# Patient Record
Sex: Male | Born: 1965 | Race: Black or African American | Hispanic: No | Marital: Married | Smoking: Former smoker
Health system: Southern US, Community
[De-identification: ages and names within clinical notes are randomized; demographics above are authoritative.]

## PROBLEM LIST (undated history)

## (undated) DIAGNOSIS — J309 Allergic rhinitis, unspecified: Secondary | ICD-10-CM

## (undated) DIAGNOSIS — K648 Other hemorrhoids: Secondary | ICD-10-CM

## (undated) DIAGNOSIS — Z8619 Personal history of other infectious and parasitic diseases: Secondary | ICD-10-CM

## (undated) DIAGNOSIS — I10 Essential (primary) hypertension: Secondary | ICD-10-CM

## (undated) DIAGNOSIS — F419 Anxiety disorder, unspecified: Secondary | ICD-10-CM

## (undated) DIAGNOSIS — R7303 Prediabetes: Secondary | ICD-10-CM

## (undated) DIAGNOSIS — T7840XA Allergy, unspecified, initial encounter: Secondary | ICD-10-CM

## (undated) DIAGNOSIS — I219 Acute myocardial infarction, unspecified: Secondary | ICD-10-CM

## (undated) DIAGNOSIS — I2510 Atherosclerotic heart disease of native coronary artery without angina pectoris: Secondary | ICD-10-CM

## (undated) HISTORY — DX: Essential (primary) hypertension: I10

## (undated) HISTORY — DX: Acute myocardial infarction, unspecified: I21.9

## (undated) HISTORY — DX: Allergic rhinitis, unspecified: J30.9

## (undated) HISTORY — DX: Atherosclerotic heart disease of native coronary artery without angina pectoris: I25.10

## (undated) HISTORY — DX: Anxiety disorder, unspecified: F41.9

## (undated) HISTORY — PX: MUSCLE BIOPSY: SHX716

## (undated) HISTORY — DX: Prediabetes: R73.03

## (undated) HISTORY — DX: Allergy, unspecified, initial encounter: T78.40XA

## (undated) HISTORY — PX: POLYPECTOMY: SHX149

## (undated) HISTORY — DX: Personal history of other infectious and parasitic diseases: Z86.19

## (undated) HISTORY — DX: Other hemorrhoids: K64.8

---

## 1997-08-09 ENCOUNTER — Emergency Department (HOSPITAL_COMMUNITY): Admission: EM | Admit: 1997-08-09 | Discharge: 1997-08-09 | Payer: Self-pay | Admitting: Emergency Medicine

## 1997-09-10 ENCOUNTER — Emergency Department (HOSPITAL_COMMUNITY): Admission: EM | Admit: 1997-09-10 | Discharge: 1997-09-10 | Payer: Self-pay | Admitting: Emergency Medicine

## 2000-12-19 ENCOUNTER — Emergency Department (HOSPITAL_COMMUNITY): Admission: EM | Admit: 2000-12-19 | Discharge: 2000-12-19 | Payer: Self-pay | Admitting: Emergency Medicine

## 2001-03-14 ENCOUNTER — Emergency Department (HOSPITAL_COMMUNITY): Admission: EM | Admit: 2001-03-14 | Discharge: 2001-03-14 | Payer: Self-pay | Admitting: Emergency Medicine

## 2001-05-03 ENCOUNTER — Emergency Department (HOSPITAL_COMMUNITY): Admission: EM | Admit: 2001-05-03 | Discharge: 2001-05-03 | Payer: Self-pay | Admitting: Emergency Medicine

## 2001-05-03 ENCOUNTER — Encounter: Payer: Self-pay | Admitting: Emergency Medicine

## 2002-09-15 ENCOUNTER — Emergency Department (HOSPITAL_COMMUNITY): Admission: EM | Admit: 2002-09-15 | Discharge: 2002-09-15 | Payer: Self-pay | Admitting: Emergency Medicine

## 2003-03-09 ENCOUNTER — Emergency Department (HOSPITAL_COMMUNITY): Admission: AD | Admit: 2003-03-09 | Discharge: 2003-03-09 | Payer: Self-pay | Admitting: Family Medicine

## 2004-05-07 ENCOUNTER — Encounter: Admission: RE | Admit: 2004-05-07 | Discharge: 2004-05-07 | Payer: Self-pay | Admitting: Family Medicine

## 2005-03-26 ENCOUNTER — Emergency Department (HOSPITAL_COMMUNITY): Admission: EM | Admit: 2005-03-26 | Discharge: 2005-03-26 | Payer: Self-pay | Admitting: Family Medicine

## 2005-07-21 ENCOUNTER — Emergency Department (HOSPITAL_COMMUNITY): Admission: EM | Admit: 2005-07-21 | Discharge: 2005-07-21 | Payer: Self-pay | Admitting: Family Medicine

## 2005-10-31 ENCOUNTER — Emergency Department (HOSPITAL_COMMUNITY): Admission: EM | Admit: 2005-10-31 | Discharge: 2005-10-31 | Payer: Self-pay | Admitting: Family Medicine

## 2006-01-29 ENCOUNTER — Emergency Department (HOSPITAL_COMMUNITY): Admission: EM | Admit: 2006-01-29 | Discharge: 2006-01-29 | Payer: Self-pay | Admitting: Family Medicine

## 2006-02-07 ENCOUNTER — Emergency Department (HOSPITAL_COMMUNITY): Admission: EM | Admit: 2006-02-07 | Discharge: 2006-02-07 | Payer: Self-pay | Admitting: Emergency Medicine

## 2006-02-19 ENCOUNTER — Encounter: Admission: RE | Admit: 2006-02-19 | Discharge: 2006-02-19 | Payer: Self-pay | Admitting: Sports Medicine

## 2006-03-08 ENCOUNTER — Encounter: Admission: RE | Admit: 2006-03-08 | Discharge: 2006-04-01 | Payer: Self-pay | Admitting: Sports Medicine

## 2006-03-28 ENCOUNTER — Emergency Department (HOSPITAL_COMMUNITY): Admission: EM | Admit: 2006-03-28 | Discharge: 2006-03-28 | Payer: Self-pay | Admitting: Family Medicine

## 2006-10-22 ENCOUNTER — Emergency Department (HOSPITAL_COMMUNITY): Admission: EM | Admit: 2006-10-22 | Discharge: 2006-10-22 | Payer: Self-pay | Admitting: Family Medicine

## 2007-12-25 ENCOUNTER — Emergency Department (HOSPITAL_COMMUNITY): Admission: EM | Admit: 2007-12-25 | Discharge: 2007-12-25 | Payer: Self-pay | Admitting: Emergency Medicine

## 2008-10-16 ENCOUNTER — Encounter: Admission: RE | Admit: 2008-10-16 | Discharge: 2008-11-21 | Payer: Self-pay | Admitting: Internal Medicine

## 2009-06-30 ENCOUNTER — Emergency Department (HOSPITAL_COMMUNITY): Admission: EM | Admit: 2009-06-30 | Discharge: 2009-06-30 | Payer: Self-pay | Admitting: Emergency Medicine

## 2010-01-05 ENCOUNTER — Emergency Department (HOSPITAL_COMMUNITY): Admission: EM | Admit: 2010-01-05 | Discharge: 2010-01-05 | Payer: Self-pay | Admitting: Family Medicine

## 2010-02-10 ENCOUNTER — Ambulatory Visit (HOSPITAL_COMMUNITY)
Admission: RE | Admit: 2010-02-10 | Discharge: 2010-02-10 | Payer: Self-pay | Source: Home / Self Care | Attending: General Surgery | Admitting: General Surgery

## 2010-05-05 LAB — SURGICAL PCR SCREEN: MRSA, PCR: NEGATIVE

## 2011-01-24 DIAGNOSIS — R7303 Prediabetes: Secondary | ICD-10-CM

## 2011-01-24 HISTORY — DX: Prediabetes: R73.03

## 2011-02-10 ENCOUNTER — Encounter: Payer: Self-pay | Admitting: *Deleted

## 2011-02-19 ENCOUNTER — Encounter: Payer: BC Managed Care – PPO | Attending: Nurse Practitioner | Admitting: *Deleted

## 2011-02-19 DIAGNOSIS — R7309 Other abnormal glucose: Secondary | ICD-10-CM | POA: Insufficient documentation

## 2011-02-19 DIAGNOSIS — Z713 Dietary counseling and surveillance: Secondary | ICD-10-CM | POA: Insufficient documentation

## 2011-02-20 ENCOUNTER — Encounter: Payer: Self-pay | Admitting: *Deleted

## 2011-02-20 NOTE — Patient Instructions (Addendum)
Goals:  Follow Diabetes Meal Plan as instructed (yellow card).  Eat 3 meals and 1-2 snacks daily; AVOID meal skipping.  Add lean protein foods to all meals/snacks.  Continue current physical activity regimen; gradually work up to 60 min/day.  Bring food record to your next nutrition visit.  Limit/avoid sweets and sugar sweetened beverages.  Limit meals away from home and high fat/fried foods.

## 2011-02-20 NOTE — Progress Notes (Signed)
Medical Nutrition Therapy:  Appt start time: 1000 end time:  1100.  Assessment:  Prediabetes.  Pt here with wife for assessment of prediabetes and education.  Wife reports h/o elevated A1c (5.8% - date unknown) that increased to 6.1% (12/30/10). Pt wants to avoid T2DM diagnosis and meds. Previous dietary intake includes excessive intake of sweets and regular soda. Mother and brother have T2DM. Pt exercises 6 days/wk and has decreased simple/refined sugar intake and eating out.  Is following a meal plan from referring  NP, but states he is confused with it.  Pt denies any pain at this time.   MEDICATIONS: See medication list; reconciled with patient.   DIETARY INTAKE:  Usual eating pattern includes 3 meals and 0-1 snacks per day. Pt reports h/o excessive intake of sweets (4 reg sized candy bars/day) and regular sodas (2-3/day); has now decreased intake.  Previously increased meals away from home, which have also decreased.   Usual physical activity:  45 min of cardio 6 days/wk  Estimated energy needs: 1800 calories 200 g carbohydrates (spread over meals/snacks) 125 g protein 55 g fat  Progress Towards Goal(s):  In progress.   Nutritional Diagnosis:  [place]-2.1 Inpaired nutrition utilization As related to glucose metabolism.  As evidenced by recent increase in A1c from 5.8% to 6.1% and NP referral for DM education.    Intervention/Goals:  Follow Diabetes Meal Plan as instructed (yellow card).  Eat 3 meals and 1-2 snacks daily; AVOID meal skipping.  Add lean protein foods to all meals/snacks.  Continue current physical activity regimen; gradually work up to 60 min/day.  Bring food record to your next nutrition visit.  Limit/avoid sweets and sugar sweetened beverages.  Limit meals away from home and high fat/fried foods.  Handouts given during visit include:  Living Well with Diabetes - [employer]  Carb Counting and Meal Planning guide  45g and 60g CHO meal plans  Snack List  Mr.  Idell Pickles Quick and Easy Diabetic Cooking (cookbook)  Monitoring/Evaluation:  Dietary intake, exercise, A1c, and body weight in 2 month(s).

## 2012-11-04 ENCOUNTER — Other Ambulatory Visit: Payer: Self-pay

## 2014-01-03 ENCOUNTER — Emergency Department (HOSPITAL_COMMUNITY): Payer: BC Managed Care – PPO

## 2014-01-03 ENCOUNTER — Encounter (HOSPITAL_COMMUNITY): Payer: Self-pay

## 2014-01-03 ENCOUNTER — Emergency Department (HOSPITAL_COMMUNITY)
Admission: EM | Admit: 2014-01-03 | Discharge: 2014-01-03 | Disposition: A | Discharge status: Home / Self Care | Payer: BC Managed Care – PPO | Attending: Emergency Medicine | Admitting: Emergency Medicine

## 2014-01-03 DIAGNOSIS — Z79899 Other long term (current) drug therapy: Secondary | ICD-10-CM | POA: Insufficient documentation

## 2014-01-03 DIAGNOSIS — Z87891 Personal history of nicotine dependence: Secondary | ICD-10-CM | POA: Insufficient documentation

## 2014-01-03 DIAGNOSIS — Z7951 Long term (current) use of inhaled steroids: Secondary | ICD-10-CM | POA: Diagnosis not present

## 2014-01-03 DIAGNOSIS — J45909 Unspecified asthma, uncomplicated: Secondary | ICD-10-CM | POA: Insufficient documentation

## 2014-01-03 DIAGNOSIS — R002 Palpitations: Secondary | ICD-10-CM | POA: Diagnosis present

## 2014-01-03 DIAGNOSIS — Z8719 Personal history of other diseases of the digestive system: Secondary | ICD-10-CM | POA: Diagnosis not present

## 2014-01-03 DIAGNOSIS — I493 Ventricular premature depolarization: Secondary | ICD-10-CM | POA: Diagnosis not present

## 2014-01-03 LAB — CBC WITH DIFFERENTIAL/PLATELET
BASOS ABS: 0 10*3/uL (ref 0.0–0.1)
Basophils Relative: 0 % (ref 0–1)
EOS PCT: 1 % (ref 0–5)
Eosinophils Absolute: 0.1 10*3/uL (ref 0.0–0.7)
HEMATOCRIT: 45.9 % (ref 39.0–52.0)
Hemoglobin: 15.9 g/dL (ref 13.0–17.0)
Lymphocytes Relative: 42 % (ref 12–46)
Lymphs Abs: 2.7 10*3/uL (ref 0.7–4.0)
MCH: 30.2 pg (ref 26.0–34.0)
MCHC: 34.6 g/dL (ref 30.0–36.0)
MCV: 87.3 fL (ref 78.0–100.0)
Monocytes Absolute: 0.5 10*3/uL (ref 0.1–1.0)
Monocytes Relative: 8 % (ref 3–12)
Neutro Abs: 3.1 10*3/uL (ref 1.7–7.7)
Neutrophils Relative %: 49 % (ref 43–77)
PLATELETS: 228 10*3/uL (ref 150–400)
RBC: 5.26 MIL/uL (ref 4.22–5.81)
RDW: 12.9 % (ref 11.5–15.5)
WBC: 6.4 10*3/uL (ref 4.0–10.5)

## 2014-01-03 LAB — COMPREHENSIVE METABOLIC PANEL
ALBUMIN: 4.1 g/dL (ref 3.5–5.2)
ALT: 44 U/L (ref 0–53)
AST: 36 U/L (ref 0–37)
Alkaline Phosphatase: 54 U/L (ref 39–117)
Anion gap: 15 (ref 5–15)
BUN: 17 mg/dL (ref 6–23)
CHLORIDE: 101 meq/L (ref 96–112)
CO2: 25 mEq/L (ref 19–32)
Calcium: 9.5 mg/dL (ref 8.4–10.5)
Creatinine, Ser: 1.18 mg/dL (ref 0.50–1.35)
GFR, EST AFRICAN AMERICAN: 84 mL/min — AB (ref >90)
GFR, EST NON AFRICAN AMERICAN: 72 mL/min — AB (ref >90)
GLUCOSE: 106 mg/dL — AB (ref 70–99)
Potassium: 3.9 mEq/L (ref 3.7–5.3)
Sodium: 141 mEq/L (ref 137–147)
Total Protein: 7.6 g/dL (ref 6.0–8.3)

## 2014-01-03 LAB — I-STAT TROPONIN, ED: TROPONIN I, POC: 0 ng/mL (ref 0.00–0.08)

## 2014-01-03 LAB — MAGNESIUM: Magnesium: 2.3 mg/dL (ref 1.5–2.5)

## 2014-01-03 NOTE — ED Notes (Signed)
Pt states that he noticed an irregular heart beat on Saturday, tonight he states it's worse.

## 2014-01-03 NOTE — ED Provider Notes (Signed)
CSN: [id number]     Arrival date & time 01/03/14  1926 History   First MD Initiated Contact with Patient 01/03/14 2000     Chief Complaint  Patient presents with  . Irregular Heart Beat     (Consider location/radiation/quality/duration/timing/severity/associated sxs/prior Treatment) HPI Mike Schmidt is a 48 y.o. male with hx of prediabetes, asthma, presents to ED with complaint of palpitations. States4 days ago started having intermittent palpitations, states feels like his heart is out of rhythm. Episodes last approximately 2-3 seconds, multiple episodes every hour. He denies any associated chest pain, shortness of breath, dizziness. No symptoms on exertion. States symptoms are independent of activity or position. No recent surgeries or travel. No new medications. No other complaints.  Past Medical History  Diagnosis Date  . Asthma   . Prediabetes 01/2011  . Internal hemorrhoids without mention of complication   . Allergic rhinitis, cause unspecified     Pollen   Past Surgical History  Procedure Laterality Date  . Muscle biopsy     Family History  Problem Relation Age of Onset  . Diabetes Mother     T2DM  . Hypertension Father   . Depression Father   . Hypertension Sister   . Hypertension Brother   . Diabetes Brother     T2DM   History  Substance Use Topics  . Smoking status: Former Research scientist (life sciences)  . Smokeless tobacco: Not on file  . Alcohol Use: No    Review of Systems  Constitutional: Negative for fever and chills.  Respiratory: Negative for cough, chest tightness and shortness of breath.   Cardiovascular: Positive for palpitations. Negative for chest pain and leg swelling.  Gastrointestinal: Negative for nausea, vomiting, abdominal pain, diarrhea and abdominal distention.  Genitourinary: Negative for dysuria, urgency, frequency and hematuria.  Musculoskeletal: Negative for myalgias, arthralgias, neck pain and neck stiffness.  Skin: Negative for rash.   Allergic/Immunologic: Negative for immunocompromised state.  Neurological: Negative for dizziness, syncope, weakness, light-headedness, numbness and headaches.      Allergies  Review of patient's allergies indicates no known allergies.  Home Medications   Prior to Admission medications   Medication Sig Start Date End Date Taking? Authorizing Provider  cetirizine (ZYRTEC) 10 MG tablet Take 10 mg by mouth at bedtime.     Yes Historical Provider, MD  diphenhydramine-acetaminophen (TYLENOL PM) 25-500 MG TABS Take 2 tablets by mouth at bedtime as needed (sleep).   Yes Historical Provider, MD  fluticasone (FLONASE) 50 MCG/ACT nasal spray Place 2 sprays into the nose 2 (two) times daily.     Yes Historical Provider, MD  Fluticasone-Salmeterol (ADVAIR DISKUS) 250-50 MCG/DOSE AEPB Inhale 1 puff into the lungs every 12 (twelve) hours.     Yes Historical Provider, MD  Multiple Vitamins-Minerals (MULTIVITAL) tablet Take 1 tablet by mouth daily.     Yes Historical Provider, MD  tadalafil (CIALIS) 20 MG tablet Take 20 mg by mouth daily as needed for erectile dysfunction (erectile dysfunction).   Yes Historical Provider, MD  zolpidem (AMBIEN CR) 12.5 MG CR tablet Take 12.5 mg by mouth at bedtime as needed for sleep (sleep).   Yes Historical Provider, MD  phentermine 37.5 MG capsule Take 37.5 mg by mouth every morning.      Historical Provider, MD   BP 125/79 mmHg  Pulse 86  Temp(Src) 97.6 F (36.4 C) (Oral)  Resp 20  Ht 5\' 9"  (1.753 m)  Wt 175 lb (79.379 kg)  BMI 25.83 kg/m2  SpO2 98% Physical Exam  Constitutional: He is oriented to person, place, and time. He appears well-developed and well-nourished. No distress.  HENT:  Head: Normocephalic and atraumatic.  Eyes: Conjunctivae are normal.  Neck: Neck supple.  Cardiovascular: Normal rate, regular rhythm and normal heart sounds.   Pulmonary/Chest: Effort normal. No respiratory distress. He has no wheezes. He has no rales.  Abdominal: Soft.  Bowel sounds are normal. He exhibits no distension. There is no tenderness. There is no rebound.  Musculoskeletal: He exhibits no edema.  Neurological: He is alert and oriented to person, place, and time.  Skin: Skin is warm and dry.  Nursing note and vitals reviewed.   ED Course  Procedures (including critical care time) Labs Review Labs Reviewed  COMPREHENSIVE METABOLIC PANEL - Abnormal; Notable for the following:    Glucose, Bld 106 (*)    Total Bilirubin <0.2 (*)    GFR calc non Af Amer 72 (*)    GFR calc Af Amer 84 (*)    All other components within normal limits  CBC WITH DIFFERENTIAL  MAGNESIUM  I-STAT TROPOININ, ED    Imaging Review Dg Chest 2 View  01/03/2014   CLINICAL DATA:  Cardiac palpitations  EXAM: CHEST  2 VIEW  COMPARISON:  08/21/2009  FINDINGS: The heart size and mediastinal contours are within normal limits. Both lungs are clear. The visualized skeletal structures shoulder rib fractures on the left.  IMPRESSION: No active cardiopulmonary disease.   Electronically Signed   By: Inez Catalina M.D.   On: 01/03/2014 20:55     EKG Interpretation   Date/Time:  Wednesday January 03 2014 19:32:44 EST Ventricular Rate:  82 PR Interval:  126 QRS Duration: 87 QT Interval:  350 QTC Calculation: 409 R Axis:   72 Text Interpretation:  Sinus rhythm Borderline low voltage, extremity leads  ST elev, probable normal early repol pattern Baseline wander in lead(s) II  aVR V6 Confirmed by Jeneen Rinks  MD, [place] ([id number]) on 01/03/2014 9:59:15 PM      MDM   Final diagnoses:  Palpitations  PVC's (premature ventricular contractions)    pt with palpitations. Single occasional PVCs noted on the monitor. No CP, no SOB, no exertional symptoms, no dizziness. Will check labs, CXR, will monitor.    10:41 PM Occasional PVCs, no other arrhythmias. Pt monitored for 3 hrs. Labs unremarkable. Pt admits to sress, not sleeping well. No other possible triggers. Discussed with Dr. Jeneen Rinks. Plan  to DC home with cardiology follow up. VS are normal. Pt stable for dc home.   Filed Vitals:   01/03/14 1935 01/03/14 2147  BP: 125/79 136/79  Pulse: 86   Temp: 97.6 F (36.4 C) 98.2 F (36.8 C)  TempSrc: Oral Oral  Resp: 20 20  Height: 5\' 9"  (1.753 m)   Weight: 175 lb (79.379 kg)   SpO2: 98% 100%     Renold Genta, PA-C 01/03/14 2242  Tanna Furry, MD 01/13/14 2336

## 2014-01-03 NOTE — Discharge Instructions (Signed)
Avoid any over the counter medications or supplements other than what you are already on. Drink plenty of fluids. Rest. Follow up with cardiology. Return if worsening symptoms.    Premature Ventricular Contraction Premature ventricular contraction (PVC) is an irregularity of the heart rhythm involving extra or skipped heartbeats. In some cases, they may occur without obvious cause or heart disease. Other times, they can be caused by an electrolyte change in the blood. These need to be corrected. They can also be seen when there is not enough oxygen going to the heart. A common cause of this is plaque or cholesterol buildup. This buildup decreases the blood supply to the heart. In addition, extra beats may be caused or aggravated by:  Excessive smoking.  Alcohol consumption.  Caffeine.  Certain medications  Some street drugs. SYMPTOMS   The sensation of feeling your heart skipping a beat (palpitations).  In many cases, the person may have no symptoms. SIGNS AND TESTS   A physical examination may show an occasional irregularity, but if the PVC beats do not happen often, they may not be found on physical exam.  Blood pressure is usually normal.  Other tests that may find extra beats of the heart are:  An EKG (electrocardiogram)  A Holter monitor which can monitor your heart over longer periods of time  An Angiogram (study of the heart arteries). TREATMENT  Usually extra heartbeats do not need treatment. The condition is treated only if symptoms are severe or if extra beats are very frequent or are causing problems. An underlying cause, if discovered, may also require treatment.  Treatment may also be needed if there may be a risk for other more serious cardiac arrhythmias.  PREVENTION   Moderation in caffeine, alcohol, and tobacco use may reduce the risk of ectopic heartbeats in some people.  Exercise often helps people who lead a sedentary (inactive) lifestyle. PROGNOSIS  PVC  heartbeats are generally harmless and do not need treatment.  RISKS AND COMPLICATIONS   Ventricular tachycardia (occasionally).  There usually are no complications.  Other arrhythmias (occasionally). SEEK IMMEDIATE MEDICAL CARE IF:   You feel palpitations that are frequent or continual.  You develop chest pain or other problems such as shortness of breath, sweating, or nausea and vomiting.  You become light-headed or faint (pass out).  You get worse or do not improve with treatment. Document Released: 09/27/2003 Document Revised: 05/04/2011 Document Reviewed: 04/08/2007 [facility] Patient Information 2015 [place], [place]. This information is not intended to replace advice given to you by your health care provider. Make sure you discuss any questions you have with your health care provider.

## 2014-02-15 NOTE — Progress Notes (Signed)
Patient ID: Mike Schmidt, male   DOB: [birth year], 48 y.o.   MRN: [id number]     Mike Schmidt is a 48 y.o. male with hx of prediabetes, asthma, presents to ED 01/03/14  with complaint of palpitations. States4 days ago started having intermittent palpitations, states feels like his heart is out of rhythm. Episodes last approximately 2-3 seconds, multiple episodes every hour. He denies any associated chest pain, shortness of breath, dizziness. No symptoms on exertion. States symptoms are independent of activity or position. No recent surgeries or travel. No new medications. No other complaints. Reviwed records and labs ok Telemetry with isolated PVC;s  CXR NAD  D/C for outpatient f/u  Since then has had more sleep and going to gym no recurrent palpitations Had issues with alcohol and drug abuse 16 years ago but not now and sober  No longer taking phentyramine Was on for short time to loose wieght when he quit smoking a while back     ROS: Denies fever, malais, weight loss, blurry vision, decreased visual acuity, cough, sputum, SOB, hemoptysis, pleuritic pain, palpitaitons, heartburn, abdominal pain, melena, lower extremity edema, claudication, or rash.  All other systems reviewed and negative   General: Affect appropriate Healthy:  appears stated age 89: normal Neck supple with no adenopathy JVP normal no bruits no thyromegaly Lungs clear with no wheezing and good diaphragmatic motion Heart:  S1/S2 no murmur,rub, gallop or click PMI normal Abdomen: benighn, BS positve, no tenderness, no AAA no bruit.  No HSM or HJR Distal pulses intact with no bruits No edema Neuro non-focal Skin warm and dry No muscular weakness  Medications Current Outpatient Prescriptions  Medication Sig Dispense Refill  . cetirizine (ZYRTEC) 10 MG tablet Take 10 mg by mouth at bedtime.      . diphenhydramine-acetaminophen (TYLENOL PM) 25-500 MG TABS Take 2 tablets by mouth at bedtime as needed (sleep).    .  fluticasone (FLONASE) 50 MCG/ACT nasal spray Place 2 sprays into the nose 2 (two) times daily.      . Fluticasone-Salmeterol (ADVAIR DISKUS) 250-50 MCG/DOSE AEPB Inhale 1 puff into the lungs every 12 (twelve) hours.      . Multiple Vitamins-Minerals (MULTIVITAL) tablet Take 1 tablet by mouth daily.      . phentermine 37.5 MG capsule Take 37.5 mg by mouth every morning.      . tadalafil (CIALIS) 20 MG tablet Take 20 mg by mouth daily as needed for erectile dysfunction (erectile dysfunction).    Marland Kitchen zolpidem (AMBIEN CR) 12.5 MG CR tablet Take 12.5 mg by mouth at bedtime as needed for sleep (sleep).     No current facility-administered medications for this visit.    Allergies Review of patient's allergies indicates no known allergies.  Family History: Family History  Problem Relation Age of Onset  . Diabetes Mother     T2DM  . Hypertension Father   . Depression Father   . Hypertension Sister   . Hypertension Brother   . Diabetes Brother     T2DM    Social History: History   Social History  . Marital Status: Married    Spouse Name: N/A    Number of Children: N/A  . Years of Education: N/A   Occupational History  . Not on file.   Social History Main Topics  . Smoking status: Former Research scientist (life sciences)  . Smokeless tobacco: Not on file  . Alcohol Use: No  . Drug Use: No  . Sexual Activity: Not on file  Other Topics Concern  . Not on file   Social History Narrative    Past Surgical History  Procedure Laterality Date  . Muscle biopsy      Past Medical History  Diagnosis Date  . Asthma   . Prediabetes 01/2011  . Internal hemorrhoids without mention of complication   . Allergic rhinitis, cause unspecified     Pollen    Electrocardiogram: SR rate 82 early repol short PR 126 msec no pre excitation 01/03/14   Assessment and Plan

## 2014-02-19 ENCOUNTER — Ambulatory Visit (INDEPENDENT_AMBULATORY_CARE_PROVIDER_SITE_OTHER): Payer: BC Managed Care – PPO | Admitting: Cardiovascular Disease

## 2014-02-19 ENCOUNTER — Encounter: Payer: Self-pay | Admitting: Cardiovascular Disease

## 2014-02-19 VITALS — BP 134/82 | HR 95 | Ht 69.0 in | Wt 179.0 lb

## 2014-02-19 DIAGNOSIS — R002 Palpitations: Secondary | ICD-10-CM

## 2014-02-19 NOTE — Assessment & Plan Note (Signed)
Benign sounding and improved with less caffeine , sleep and going back to gym  Echo to r/o structural heart disease given history of smoking, drug use and ETOH

## 2014-02-19 NOTE — Patient Instructions (Signed)
Your physician has requested that you have an echocardiogram. Echocardiography is a painless test that uses sound waves to create images of your heart. It provides your doctor with information about the size and shape of your heart and how well your heart's chambers and valves are working. This procedure takes approximately one hour. There are no restrictions for this procedure.  Follow up with Dr. Johnsie Cancel as needed.

## 2014-02-20 ENCOUNTER — Ambulatory Visit (HOSPITAL_COMMUNITY): Payer: BC Managed Care – PPO | Attending: Cardiovascular Disease

## 2014-02-20 DIAGNOSIS — Z87891 Personal history of nicotine dependence: Secondary | ICD-10-CM | POA: Diagnosis not present

## 2014-02-20 DIAGNOSIS — J45909 Unspecified asthma, uncomplicated: Secondary | ICD-10-CM | POA: Diagnosis not present

## 2014-02-20 DIAGNOSIS — F1910 Other psychoactive substance abuse, uncomplicated: Secondary | ICD-10-CM | POA: Diagnosis not present

## 2014-02-20 DIAGNOSIS — F1021 Alcohol dependence, in remission: Secondary | ICD-10-CM | POA: Insufficient documentation

## 2014-02-20 DIAGNOSIS — R002 Palpitations: Secondary | ICD-10-CM | POA: Insufficient documentation

## 2014-02-20 NOTE — Progress Notes (Signed)
2D Echo completed. 02/20/2014

## 2015-11-10 NOTE — Progress Notes (Signed)
Cardiology Office Note    Date:  11/12/2015   ID:  KASH MCLINN, DOB [birth year], MRN [id number]  PCP:  Merrilee Seashore, MD  Cardiologist:  Dr. Johnsie Cancel   CC: fluttering in chest and chest pain   History of Present Illness:  Mike Schmidt is a 50 y.o. male with a history of prediabtes, obesity, substance abuse in remission and asthma who presents to clinic for evaluation of palpitations.  He saw Dr. Johnsie Cancel in 01/2014 for post ER follow up of palpitations. He had been getting more sleep and exercise and less caffiene and felt better at the time. ECHO was ordered given hx of smoking, drug use and ETOH. This showed normal LV function with no WMAS and mild LAE.  Today he presents to clinic for follow up. Since that time he has had intermittent palpitations as well as some sharp stabbing pain across his chest. Also has had some back pain and leg cramping. He has palpitations basically everyday intermittently. No associated CP or SOB. He does get chest pain but not related to palpitations. The chest pain feels like a sharp shooting pain or a tightness in the left side of his chest. Not related to exertion. He still does work out a little with push ups/sit ups but does not go to the gym as much as he used to. No smoking, alcohol or drugs.  He drinks one cup of coffee in the AM. No LE edema, orthopnea or PND. No dizziness or syncope. Drinks one cup of coffee in the AM, otherwise no caffeine. He does have a lot of stress in his life related to work and family. Wife with him today who sees him grabbing his chest from time to time.   No HTN or DM and he see his PCP at least once a year. He thinks his cholesterol was a little elevated once but then his levels got better. His dad and brother both had heart problems. His brother has his first MI in his 67s but was a smoker, drinker and drug user. His father had CHF and MIs and CVAs in 24s and 59s. Also heavy smoker and drinker.      Past Medical  History:  Diagnosis Date  . Allergic rhinitis, cause unspecified    Pollen  . Asthma   . Internal hemorrhoids without mention of complication   . Prediabetes 01/2011    Past Surgical History:  Procedure Laterality Date  . MUSCLE BIOPSY      Current Medications: Outpatient Medications Prior to Visit  Medication Sig Dispense Refill  . cetirizine (ZYRTEC) 10 MG tablet Take 10 mg by mouth at bedtime.      . diphenhydramine-acetaminophen (TYLENOL PM) 25-500 MG TABS Take 2 tablets by mouth at bedtime as needed (sleep).    . fluticasone (FLONASE) 50 MCG/ACT nasal spray Place 2 sprays into the nose 2 (two) times daily.      . Fluticasone-Salmeterol (ADVAIR DISKUS) 250-50 MCG/DOSE AEPB Inhale 1 puff into the lungs every 12 (twelve) hours.      . Multiple Vitamins-Minerals (MULTIVITAL) tablet Take 1 tablet by mouth daily.      . tadalafil (CIALIS) 20 MG tablet Take 20 mg by mouth daily as needed for erectile dysfunction (erectile dysfunction).    . phentermine 37.5 MG capsule Take 37.5 mg by mouth every morning.      . zolpidem (AMBIEN CR) 12.5 MG CR tablet Take 12.5 mg by mouth at bedtime as needed for sleep (sleep).  No facility-administered medications prior to visit.      Allergies:   Review of patient's allergies indicates no known allergies.   Social History   Social History  . Marital status: Married    Spouse name: N/A  . Number of children: N/A  . Years of education: N/A   Social History Main Topics  . Smoking status: Former Research scientist (life sciences)  . Smokeless tobacco: Never Used  . Alcohol use No  . Drug use: No  . Sexual activity: Not Asked   Other Topics Concern  . None   Social History Narrative  . None     Family History:  The patient's *family history includes Depression in his father; Diabetes in his brother and mother; Hypertension in his brother, father, and sister.     ROS:   Please see the history of present illness.    ROS All other systems reviewed and are  negative.   PHYSICAL EXAM:   VS:  BP 132/80   Pulse 94   Ht 5\' 8"  (1.727 m)   Wt 184 lb 6.4 oz (83.6 kg)   SpO2 95%   BMI 28.04 kg/m    GEN: Well nourished, well developed, in no acute distress  HEENT: normal  Neck: no JVD, carotid bruits, or masses Cardiac: RRR; no murmurs, rubs, or gallops,no edema  Respiratory:  clear to auscultation bilaterally, normal work of breathing GI: soft, nontender, nondistended, + BS MS: no deformity or atrophy  Skin: warm and dry, no rash Neuro:  Alert and Oriented x 3, Strength and sensation are intact Psych: euthymic mood, full affect  Wt Readings from Last 3 Encounters:  11/12/15 184 lb 6.4 oz (83.6 kg)  02/19/14 179 lb (81.2 kg)  01/03/14 175 lb (79.4 kg)      Studies/Labs Reviewed:   EKG:  EKG is ordered today.  The ekg ordered today demonstrates NSR HR 93  Recent Labs: No results found for requested labs within last 8760 hours.   Lipid Panel No results found for: CHOL, TRIG, HDL, CHOLHDL, VLDL, LDLCALC, LDLDIRECT  Additional studies/ records that were reviewed today include:  2D ECHO: 02/20/2014 LV EF: 60% -  65% Study Conclusion - Left ventricle: The cavity size was normal. Wall thickness was normal. Systolic function was normal. The estimated ejection fraction was in the range of 60% to 65%. Wall motion was normal; there were no regional wall motion abnormalities. - Right atrium: The atrium was mildly dilated.   ASSESSMENT & PLAN:   Palpitations: he has subjective palpitatations everyday. Will get a 48 hour holter monitor  Chest pain: atypical but with family history and history of smoking/drug abuse, will get a POET. Will check lipid panel today.   Hx of substance abuse: in remission.   LE cramping: will check a BMET   Medication Adjustments/Labs and Tests Ordered: Current medicines are reviewed at length with the patient today.  Concerns regarding medicines are outlined above.  Medication changes, Labs and  Tests ordered today are listed in the Patient Instructions below. Patient Instructions  Medication Instructions:  Your physician recommends that you continue on your current medications as directed. Please refer to the Current Medication list given to you today.    Labwork: TODAY;  BMET & LIPID PANEL  Testing/Procedures: Your physician has requested that you have an exercise tolerance test. For further information please visit [email or web address]. Please also follow instruction sheet, as given.  Your physician has recommended that you wear a holter monitor. Holter monitors are medical devices  that record the heart's electrical activity. Doctors most often use these monitors to diagnose arrhythmias. Arrhythmias are problems with the speed or rhythm of the heartbeat. The monitor is a small, portable device. You can wear one while you do your normal daily activities. This is usually used to diagnose what is causing palpitations/syncope (passing out).    Follow-Up: Your physician recommends that you schedule a follow-up appointment in: WILL BE BASED UPON YOUR TEST RESULTS   Any Other Special Instructions Will Be Listed Below (If Applicable).   Exercise Stress Electrocardiogram An exercise stress electrocardiogram is a test to check how blood flows to your heart. It is done to find areas of poor blood flow. You will need to walk on a treadmill for this test. The electrocardiogram will record your heartbeat when you are at rest and when you are exercising. BEFORE THE PROCEDURE  Do not have drinks with caffeine or foods with caffeine for 24 hours before the test, or as told by your doctor. This includes coffee, tea (even decaf tea), sodas, chocolate, and cocoa.  Follow your doctor's instructions about eating and drinking before the test.  Ask your doctor what medicines you should or should not take before the test. Take your medicines with water unless told by your doctor not to.  If you  use an inhaler, bring it with you to the test.  Bring a snack to eat after the test.  Do not  smoke for 4 hours before the test.  Do not put lotions, powders, creams, or oils on your chest before the test.  Wear comfortable shoes and clothing. PROCEDURE  You will have patches put on your chest. Small areas of your chest may need to be shaved. Wires will be connected to the patches.  Your heart rate will be watched while you are resting and while you are exercising.  You will walk on the treadmill. The treadmill will slowly get faster to raise your heart rate.  The test will take about 1-2 hours. AFTER THE PROCEDURE  Your heart rate and blood pressure will be watched after the test.  You may return to your normal diet, activities, and medicines or as told by your doctor.   This information is not intended to replace advice given to you by your health care provider. Make sure you discuss any questions you have with your health care provider.   Document Released: 07/29/2007 Document Revised: 03/02/2014 Document Reviewed: 10/17/2012 Elsevier Interactive Patient Education 2016 Elsevier Inc.     Holter Monitoring A Holter monitor is a small device that is used to detect abnormal heart rhythms. It clips to your clothing and is connected by wires to flat, sticky disks (electrodes) that attach to your chest. It is worn continuously for 24-48 hours. HOME CARE INSTRUCTIONS  Wear your Holter monitor at all times, even while exercising and sleeping, for as long as directed by your health care provider.  Make sure that the Holter monitor is safely clipped to your clothing or close to your body as recommended by your health care provider.  Do not get the monitor or wires wet.  Do not put body lotion or moisturizer on your chest.  Keep your skin clean.  Keep a diary of your daily activities, such as walking and doing chores. If you feel that your heartbeat is abnormal or that your  heart is fluttering or skipping a beat:  Record what you are doing when it happens.  Record what time of day  the symptoms occur.  Return your Holter monitor as directed by your health care provider.  Keep all follow-up visits as directed by your health care provider. This is important. SEEK IMMEDIATE MEDICAL CARE IF:  You feel lightheaded or you faint.  You have trouble breathing.  You feel pain in your chest, upper arm, or jaw.  You feel sick to your stomach and your skin is pale, cool, or damp.  You heartbeat feels unusual or abnormal.   This information is not intended to replace advice given to you by your health care provider. Make sure you discuss any questions you have with your health care provider.   Document Released: 11/08/2003 Document Revised: 03/02/2014 Document Reviewed: 09/18/2013 Elsevier Interactive Patient Education [employer].   If you need a refill on your cardiac medications before your next appointment, please call your pharmacy.       Signed, Angelena Form, PA-C  11/12/2015 10:38 AM    [facility] [address], [place], [place]  [address] Phone: [phone]; Fax: [phone]

## 2015-11-11 ENCOUNTER — Encounter: Payer: Self-pay | Admitting: Physician Assistant

## 2015-11-12 ENCOUNTER — Encounter (INDEPENDENT_AMBULATORY_CARE_PROVIDER_SITE_OTHER): Payer: Self-pay

## 2015-11-12 ENCOUNTER — Encounter: Payer: Self-pay | Admitting: Physician Assistant

## 2015-11-12 ENCOUNTER — Ambulatory Visit (INDEPENDENT_AMBULATORY_CARE_PROVIDER_SITE_OTHER): Payer: BLUE CROSS/BLUE SHIELD | Admitting: Physician Assistant

## 2015-11-12 VITALS — BP 132/80 | HR 94 | Ht 68.0 in | Wt 184.4 lb

## 2015-11-12 DIAGNOSIS — R079 Chest pain, unspecified: Secondary | ICD-10-CM | POA: Diagnosis not present

## 2015-11-12 DIAGNOSIS — R002 Palpitations: Secondary | ICD-10-CM | POA: Diagnosis not present

## 2015-11-12 DIAGNOSIS — E785 Hyperlipidemia, unspecified: Secondary | ICD-10-CM

## 2015-11-12 DIAGNOSIS — F1911 Other psychoactive substance abuse, in remission: Secondary | ICD-10-CM

## 2015-11-12 DIAGNOSIS — R252 Cramp and spasm: Secondary | ICD-10-CM

## 2015-11-12 DIAGNOSIS — F1910 Other psychoactive substance abuse, uncomplicated: Secondary | ICD-10-CM | POA: Diagnosis not present

## 2015-11-12 LAB — LIPID PANEL
CHOL/HDL RATIO: 3.9 ratio (ref <=5.0)
Cholesterol: 211 mg/dL — ABNORMAL HIGH (ref 125–200)
HDL: 54 mg/dL (ref >=40)
LDL CALC: 131 mg/dL — AB (ref <130)
Triglycerides: 129 mg/dL (ref <150)
VLDL: 26 mg/dL (ref <30)

## 2015-11-12 LAB — BASIC METABOLIC PANEL
BUN: 11 mg/dL (ref 7–25)
CO2: 23 mmol/L (ref 20–31)
Calcium: 9.2 mg/dL (ref 8.6–10.3)
Chloride: 105 mmol/L (ref 98–110)
Creat: 1.03 mg/dL (ref 0.60–1.35)
GLUCOSE: 113 mg/dL — AB (ref 65–99)
POTASSIUM: 4.3 mmol/L (ref 3.5–5.3)
SODIUM: 140 mmol/L (ref 135–146)

## 2015-11-12 NOTE — Patient Instructions (Signed)
Medication Instructions:  Your physician recommends that you continue on your current medications as directed. Please refer to the Current Medication list given to you today.    Labwork: TODAY;  BMET & LIPID PANEL  Testing/Procedures: Your physician has requested that you have an exercise tolerance test. For further information please visit [email or web address]. Please also follow instruction sheet, as given.  Your physician has recommended that you wear a holter monitor. Holter monitors are medical devices that record the heart's electrical activity. Doctors most often use these monitors to diagnose arrhythmias. Arrhythmias are problems with the speed or rhythm of the heartbeat. The monitor is a small, portable device. You can wear one while you do your normal daily activities. This is usually used to diagnose what is causing palpitations/syncope (passing out).    Follow-Up: Your physician recommends that you schedule a follow-up appointment in: WILL BE BASED UPON YOUR TEST RESULTS   Any Other Special Instructions Will Be Listed Below (If Applicable).   Exercise Stress Electrocardiogram An exercise stress electrocardiogram is a test to check how blood flows to your heart. It is done to find areas of poor blood flow. You will need to walk on a treadmill for this test. The electrocardiogram will record your heartbeat when you are at rest and when you are exercising. BEFORE THE PROCEDURE  Do not have drinks with caffeine or foods with caffeine for 24 hours before the test, or as told by your doctor. This includes coffee, tea (even decaf tea), sodas, chocolate, and cocoa.  Follow your doctor's instructions about eating and drinking before the test.  Ask your doctor what medicines you should or should not take before the test. Take your medicines with water unless told by your doctor not to.  If you use an inhaler, bring it with you to the test.  Bring a snack to eat after the  test.  Do not  smoke for 4 hours before the test.  Do not put lotions, powders, creams, or oils on your chest before the test.  Wear comfortable shoes and clothing. PROCEDURE  You will have patches put on your chest. Small areas of your chest may need to be shaved. Wires will be connected to the patches.  Your heart rate will be watched while you are resting and while you are exercising.  You will walk on the treadmill. The treadmill will slowly get faster to raise your heart rate.  The test will take about 1-2 hours. AFTER THE PROCEDURE  Your heart rate and blood pressure will be watched after the test.  You may return to your normal diet, activities, and medicines or as told by your doctor.   This information is not intended to replace advice given to you by your health care provider. Make sure you discuss any questions you have with your health care provider.   Document Released: 07/29/2007 Document Revised: 03/02/2014 Document Reviewed: 10/17/2012 Elsevier Interactive Patient Education 2016 Elsevier Inc.     Holter Monitoring A Holter monitor is a small device that is used to detect abnormal heart rhythms. It clips to your clothing and is connected by wires to flat, sticky disks (electrodes) that attach to your chest. It is worn continuously for 24-48 hours. HOME CARE INSTRUCTIONS  Wear your Holter monitor at all times, even while exercising and sleeping, for as long as directed by your health care provider.  Make sure that the Holter monitor is safely clipped to your clothing or close to your body as recommended  by your health care provider.  Do not get the monitor or wires wet.  Do not put body lotion or moisturizer on your chest.  Keep your skin clean.  Keep a diary of your daily activities, such as walking and doing chores. If you feel that your heartbeat is abnormal or that your heart is fluttering or skipping a beat:  Record what you are doing when it  happens.  Record what time of day the symptoms occur.  Return your Holter monitor as directed by your health care provider.  Keep all follow-up visits as directed by your health care provider. This is important. SEEK IMMEDIATE MEDICAL CARE IF:  You feel lightheaded or you faint.  You have trouble breathing.  You feel pain in your chest, upper arm, or jaw.  You feel sick to your stomach and your skin is pale, cool, or damp.  You heartbeat feels unusual or abnormal.   This information is not intended to replace advice given to you by your health care provider. Make sure you discuss any questions you have with your health care provider.   Document Released: 11/08/2003 Document Revised: 03/02/2014 Document Reviewed: 09/18/2013 Elsevier Interactive Patient Education [employer].   If you need a refill on your cardiac medications before your next appointment, please call your pharmacy.

## 2015-11-14 ENCOUNTER — Telehealth: Payer: Self-pay | Admitting: Physician Assistant

## 2015-11-14 DIAGNOSIS — R7301 Impaired fasting glucose: Secondary | ICD-10-CM | POA: Diagnosis not present

## 2015-11-14 DIAGNOSIS — Z23 Encounter for immunization: Secondary | ICD-10-CM | POA: Diagnosis not present

## 2015-11-14 NOTE — Telephone Encounter (Signed)
New message ° ° ° ° ° °Returning a call to get lab results °

## 2015-11-14 NOTE — Telephone Encounter (Signed)
Returned pts call and we dicussed pts lab results.

## 2015-11-20 ENCOUNTER — Ambulatory Visit (INDEPENDENT_AMBULATORY_CARE_PROVIDER_SITE_OTHER): Payer: BLUE CROSS/BLUE SHIELD

## 2015-11-20 DIAGNOSIS — R002 Palpitations: Secondary | ICD-10-CM | POA: Diagnosis not present

## 2015-11-20 DIAGNOSIS — R079 Chest pain, unspecified: Secondary | ICD-10-CM | POA: Diagnosis not present

## 2015-11-20 LAB — EXERCISE TOLERANCE TEST
CHL CUP STRESS STAGE 1 HR: 100 {beats}/min
CHL CUP STRESS STAGE 1 SBP: 157 mmHg
CHL CUP STRESS STAGE 1 SPEED: 0 mph
CHL CUP STRESS STAGE 10 DBP: 62 mmHg
CHL CUP STRESS STAGE 10 SBP: 169 mmHg
CHL CUP STRESS STAGE 2 GRADE: 0 %
CHL CUP STRESS STAGE 2 HR: 108 {beats}/min
CHL CUP STRESS STAGE 4 GRADE: 0.1 %
CHL CUP STRESS STAGE 4 HR: 102 {beats}/min
CHL CUP STRESS STAGE 4 SPEED: 1 mph
CHL CUP STRESS STAGE 5 DBP: 75 mmHg
CHL CUP STRESS STAGE 5 GRADE: 10 %
CHL CUP STRESS STAGE 5 HR: 125 {beats}/min
CHL CUP STRESS STAGE 6 DBP: 83 mmHg
CHL CUP STRESS STAGE 6 SBP: 192 mmHg
CHL CUP STRESS STAGE 8 SPEED: 4.2 mph
CHL CUP STRESS STAGE 9 DBP: 41 mmHg
CHL CUP STRESS STAGE 9 GRADE: 0 %
CHL CUP STRESS STAGE 9 HR: 160 {beats}/min
CHL CUP STRESS STAGE 9 SPEED: 1.5 mph
CSEPEW: 13.4 METS
CSEPPHR: 173 {beats}/min
Exercise duration (min): 11 min
Exercise duration (sec): 0 s
MPHR: 172 {beats}/min
Percent HR: 100 %
Percent of predicted max HR: 100 %
RPE: 16
Rest HR: 100 {beats}/min
Stage 1 DBP: 95 mmHg
Stage 1 Grade: 0 %
Stage 10 Grade: 0 %
Stage 10 HR: 120 {beats}/min
Stage 10 Speed: 0 mph
Stage 2 Speed: 0 mph
Stage 3 Grade: 0 %
Stage 3 HR: 103 {beats}/min
Stage 3 Speed: 1 mph
Stage 5 SBP: 167 mmHg
Stage 5 Speed: 1.7 mph
Stage 6 Grade: 12 %
Stage 6 HR: 146 {beats}/min
Stage 6 Speed: 2.5 mph
Stage 7 DBP: 83 mmHg
Stage 7 Grade: 14 %
Stage 7 HR: 157 {beats}/min
Stage 7 SBP: 185 mmHg
Stage 7 Speed: 3.4 mph
Stage 8 Grade: 16 %
Stage 8 HR: 173 {beats}/min
Stage 9 SBP: 175 mmHg

## 2016-03-02 DIAGNOSIS — R7301 Impaired fasting glucose: Secondary | ICD-10-CM | POA: Diagnosis not present

## 2016-03-02 DIAGNOSIS — Z0000 Encounter for general adult medical examination without abnormal findings: Secondary | ICD-10-CM | POA: Diagnosis not present

## 2016-03-02 DIAGNOSIS — R740 Nonspecific elevation of levels of transaminase and lactic acid dehydrogenase [LDH]: Secondary | ICD-10-CM | POA: Diagnosis not present

## 2016-03-02 DIAGNOSIS — R748 Abnormal levels of other serum enzymes: Secondary | ICD-10-CM | POA: Diagnosis not present

## 2016-03-30 DIAGNOSIS — R748 Abnormal levels of other serum enzymes: Secondary | ICD-10-CM | POA: Diagnosis not present

## 2016-03-30 DIAGNOSIS — Z0000 Encounter for general adult medical examination without abnormal findings: Secondary | ICD-10-CM | POA: Diagnosis not present

## 2016-03-30 DIAGNOSIS — R7301 Impaired fasting glucose: Secondary | ICD-10-CM | POA: Diagnosis not present

## 2016-03-30 DIAGNOSIS — J4520 Mild intermittent asthma, uncomplicated: Secondary | ICD-10-CM | POA: Diagnosis not present

## 2016-05-28 DIAGNOSIS — R748 Abnormal levels of other serum enzymes: Secondary | ICD-10-CM | POA: Diagnosis not present

## 2016-05-28 DIAGNOSIS — Z634 Disappearance and death of family member: Secondary | ICD-10-CM | POA: Diagnosis not present

## 2016-05-28 DIAGNOSIS — R7301 Impaired fasting glucose: Secondary | ICD-10-CM | POA: Diagnosis not present

## 2016-05-28 DIAGNOSIS — R030 Elevated blood-pressure reading, without diagnosis of hypertension: Secondary | ICD-10-CM | POA: Diagnosis not present

## 2016-08-04 DIAGNOSIS — L820 Inflamed seborrheic keratosis: Secondary | ICD-10-CM | POA: Diagnosis not present

## 2016-11-27 DIAGNOSIS — R748 Abnormal levels of other serum enzymes: Secondary | ICD-10-CM | POA: Diagnosis not present

## 2016-11-27 DIAGNOSIS — R030 Elevated blood-pressure reading, without diagnosis of hypertension: Secondary | ICD-10-CM | POA: Diagnosis not present

## 2016-11-27 DIAGNOSIS — F322 Major depressive disorder, single episode, severe without psychotic features: Secondary | ICD-10-CM | POA: Diagnosis not present

## 2016-11-27 DIAGNOSIS — F5101 Primary insomnia: Secondary | ICD-10-CM | POA: Diagnosis not present

## 2017-01-11 ENCOUNTER — Encounter (HOSPITAL_BASED_OUTPATIENT_CLINIC_OR_DEPARTMENT_OTHER): Payer: Self-pay | Admitting: *Deleted

## 2017-01-11 ENCOUNTER — Emergency Department (HOSPITAL_BASED_OUTPATIENT_CLINIC_OR_DEPARTMENT_OTHER): Payer: Worker's Compensation

## 2017-01-11 ENCOUNTER — Other Ambulatory Visit: Payer: Self-pay

## 2017-01-11 DIAGNOSIS — Z23 Encounter for immunization: Secondary | ICD-10-CM | POA: Diagnosis not present

## 2017-01-11 DIAGNOSIS — Z79899 Other long term (current) drug therapy: Secondary | ICD-10-CM | POA: Diagnosis not present

## 2017-01-11 DIAGNOSIS — J45909 Unspecified asthma, uncomplicated: Secondary | ICD-10-CM | POA: Diagnosis not present

## 2017-01-11 DIAGNOSIS — Y939 Activity, unspecified: Secondary | ICD-10-CM | POA: Diagnosis not present

## 2017-01-11 DIAGNOSIS — W458XXA Other foreign body or object entering through skin, initial encounter: Secondary | ICD-10-CM | POA: Diagnosis not present

## 2017-01-11 DIAGNOSIS — Y999 Unspecified external cause status: Secondary | ICD-10-CM | POA: Insufficient documentation

## 2017-01-11 DIAGNOSIS — S51842A Puncture wound with foreign body of left forearm, initial encounter: Secondary | ICD-10-CM | POA: Diagnosis not present

## 2017-01-11 DIAGNOSIS — Z87891 Personal history of nicotine dependence: Secondary | ICD-10-CM | POA: Diagnosis not present

## 2017-01-11 DIAGNOSIS — S50852A Superficial foreign body of left forearm, initial encounter: Secondary | ICD-10-CM | POA: Diagnosis present

## 2017-01-11 DIAGNOSIS — Y929 Unspecified place or not applicable: Secondary | ICD-10-CM | POA: Diagnosis not present

## 2017-01-11 NOTE — ED Triage Notes (Signed)
He was breaking wood at work tonight and got a splinter in his forearm. He does not think UDS is a requirement but he will contact his supervisor.

## 2017-01-12 ENCOUNTER — Emergency Department (HOSPITAL_BASED_OUTPATIENT_CLINIC_OR_DEPARTMENT_OTHER)
Admission: EM | Admit: 2017-01-12 | Discharge: 2017-01-12 | Disposition: A | Discharge status: Home / Self Care | Payer: Worker's Compensation | Attending: Emergency Medicine | Admitting: Emergency Medicine

## 2017-01-12 DIAGNOSIS — S51842A Puncture wound with foreign body of left forearm, initial encounter: Secondary | ICD-10-CM

## 2017-01-12 MED ORDER — LIDOCAINE-EPINEPHRINE 2 %-1:100000 IJ SOLN
20.0000 mL | Freq: Once | INTRAMUSCULAR | Status: AC
  Administered 2017-01-12: 20 mL
  Filled 2017-01-12: qty 1

## 2017-01-12 MED ORDER — CEPHALEXIN 250 MG PO CAPS
1000.0000 mg | ORAL_CAPSULE | Freq: Once | ORAL | Status: AC
  Administered 2017-01-12: 1000 mg via ORAL
  Filled 2017-01-12: qty 4

## 2017-01-12 MED ORDER — CEPHALEXIN 500 MG PO CAPS: 500.0000 mg | ORAL_CAPSULE | Freq: Four times a day (QID) | ORAL | 0 refills | Status: DC

## 2017-01-12 MED ORDER — TETANUS-DIPHTH-ACELL PERTUSSIS 5-2.5-18.5 LF-MCG/0.5 IM SUSP
0.5000 mL | Freq: Once | INTRAMUSCULAR | Status: AC
  Administered 2017-01-12: 0.5 mL via INTRAMUSCULAR
  Filled 2017-01-12: qty 0.5

## 2017-01-12 NOTE — ED Provider Notes (Signed)
[facility] Provider Note: Mike Spurling, MD, FACEP  CSN: [id number] MRN: [id number] ARRIVAL: 01/11/17 at 2141 ROOM: [place]  Arm Injury   HISTORY OF PRESENT ILLNESS  01/12/17 2:40 AM Mike Schmidt is a 51 y.o. male who was moving a piece of plywood at work just prior to arrival when the plywood fragmented and a shard impaled the skin of his left forearm.  This shard is palpable under the skin.  There is a puncture wound at one end of the shard.  He rates associated pain as a 2 out of 10, worse with palpation.  He attempted to remove the shard but was unsuccessful.   Past Medical History:  Diagnosis Date  . Allergic rhinitis, cause unspecified    Pollen  . Asthma   . Internal hemorrhoids without mention of complication   . Prediabetes 01/2011    Past Surgical History:  Procedure Laterality Date  . MUSCLE BIOPSY      Family History  Problem Relation Age of Onset  . Diabetes Mother        T2DM  . Hypertension Father   . Depression Father   . Hypertension Sister   . Hypertension Brother   . Diabetes Brother        T2DM    Social History   Tobacco Use  . Smoking status: Former Research scientist (life sciences)  . Smokeless tobacco: Never Used  Substance Use Topics  . Alcohol use: No  . Drug use: No    Prior to Admission medications   Medication Sig Start Date End Date Taking? Authorizing Provider  cetirizine (ZYRTEC) 10 MG tablet Take 10 mg by mouth at bedtime.     Yes [provider]  famciclovir (FAMVIR) 250 MG tablet Take 250 mg by mouth 2 (two) times daily. 10/25/15  Yes [provider]  fluticasone (FLONASE) 50 MCG/ACT nasal spray Place 2 sprays into the nose 2 (two) times daily.     Yes [provider]  Fluticasone-Salmeterol (ADVAIR DISKUS) 250-50 MCG/DOSE AEPB Inhale 1 puff into the lungs every 12 (twelve) hours.     Yes [provider]  LORAZEPAM PO Take by mouth.   Yes [provider]  Multiple  Vitamins-Minerals (MULTIVITAL) tablet Take 1 tablet by mouth daily.     Yes [provider]  diphenhydramine-acetaminophen (TYLENOL PM) 25-500 MG TABS Take 2 tablets by mouth at bedtime as needed (sleep).    [provider]  metaxalone (SKELAXIN) 800 MG tablet Take 1 tablet by mouth daily. 10/30/15   [provider]  tadalafil (CIALIS) 20 MG tablet Take 20 mg by mouth daily as needed for erectile dysfunction (erectile dysfunction).    [provider]    Allergies Patient has no known allergies.   REVIEW OF SYSTEMS  Negative except as noted here or in the History of Present Illness.   PHYSICAL EXAMINATION  Initial Vital Signs Blood pressure (!) 151/94, pulse 88, temperature 98.6 F (37 C), temperature source Oral, resp. rate 20, height 5\' 9"  (1.753 m), weight 81.6 kg (180 lb), SpO2 99 %.  Examination General: Well-developed, well-nourished male in no acute distress; appearance consistent with age of record HENT: normocephalic; atraumatic Eyes: Normal appearance Neck: supple Heart: regular rate and rhythm Lungs: clear to auscultation bilaterally Abdomen: soft; nondistended Extremities: No deformity; full range of motion; puncture wound left forearm with visible and palpable foreign body under skin:   Neurologic: Awake, alert and oriented; motor function intact in all  extremities and symmetric; no facial droop Skin: Warm and dry Psychiatric: Normal mood and affect   RESULTS  Summary of this visit's results, reviewed by myself:   EKG Interpretation  Date/Time:    Ventricular Rate:    PR Interval:    QRS Duration:   QT Interval:    QTC Calculation:   R Axis:     Text Interpretation:        Laboratory Studies: No results found for this or any previous visit (from the past 24 hour(s)). Imaging Studies: Dg Forearm Left  Result Date: 01/11/2017 CLINICAL DATA:  Patient was at work today handling a piece of wood and had a splinter stuck  in the medial left forearm. EXAM: LEFT FOREARM - 2 VIEW COMPARISON:  None. FINDINGS: The punctate subcutaneous soft tissue density along the medial aspect of the left forearm at the junction of the middle and distal third may represent a tiny foreign body measuring approximate 3 mm in length on the AP view. It is not well visualized on the lateral projection. No acute osseous abnormality is seen. IMPRESSION: Along the medial aspect of the distal left forearm is a 3 mm in length subcutaneous soft tissue density suspicious for a radiopaque foreign body. This is best seen on the AP view at the junction of the middle and distal third. No acute osseous abnormality. Electronically Signed   By: Ashley Royalty M.D.   On: 01/11/2017 22:30    ED COURSE  Nursing notes and initial vitals signs, including pulse oximetry, reviewed.  Vitals:   01/11/17 2147 01/11/17 2149  BP:  (!) 151/94  Pulse:  88  Resp:  20  Temp:  98.6 F (37 C)  TempSrc:  Oral  SpO2:  99%  Weight: 81.6 kg (180 lb)   Height: 5\' 9"  (1.753 m)     PROCEDURES   INCISION AND REMOVAL Performed by: Shanon Rosser L Consent: Verbal consent obtained. Risks and benefits: risks, benefits and alternatives were discussed Type: Foreign body  Body area: Skin of left forearm  Anesthesia: local infiltration  Local anesthetic: lidocaine 2 % with epinephrine  Anesthetic total: 3 ml  Incision (approximately 1 cm) was made with a scalpel at the distal end of foreign body opposite the entry wound  Complexity: complex Sharp and blunt dissection to localize and remove foreign body  Foreign body removed: 2 cm wood splinter; wound subsequently explored and no further foreign objects were seen or palpated  Packing material: None; wound left to heal by secondary intention as this represents a potentially contaminated puncture wound  Patient tolerance: Patient tolerated the procedure well with no immediate complications.    ED DIAGNOSES      ICD-10-CM   1. Puncture wound of forearm with foreign body, left, initial encounter G53.646O        Shanon Rosser, MD 01/12/17 [phone]

## 2017-01-12 NOTE — ED Notes (Signed)
Pt verbalizes understanding of d/c instructions and denies any further needs at this time. 

## 2017-02-12 DIAGNOSIS — F322 Major depressive disorder, single episode, severe without psychotic features: Secondary | ICD-10-CM | POA: Diagnosis not present

## 2017-02-12 DIAGNOSIS — R748 Abnormal levels of other serum enzymes: Secondary | ICD-10-CM | POA: Diagnosis not present

## 2017-04-07 DIAGNOSIS — Z79899 Other long term (current) drug therapy: Secondary | ICD-10-CM | POA: Diagnosis not present

## 2017-04-07 DIAGNOSIS — Z125 Encounter for screening for malignant neoplasm of prostate: Secondary | ICD-10-CM | POA: Diagnosis not present

## 2017-04-07 DIAGNOSIS — R748 Abnormal levels of other serum enzymes: Secondary | ICD-10-CM | POA: Diagnosis not present

## 2017-04-14 DIAGNOSIS — R748 Abnormal levels of other serum enzymes: Secondary | ICD-10-CM | POA: Diagnosis not present

## 2017-04-14 DIAGNOSIS — J4520 Mild intermittent asthma, uncomplicated: Secondary | ICD-10-CM | POA: Diagnosis not present

## 2017-04-14 DIAGNOSIS — Z0000 Encounter for general adult medical examination without abnormal findings: Secondary | ICD-10-CM | POA: Diagnosis not present

## 2017-04-14 DIAGNOSIS — R740 Nonspecific elevation of levels of transaminase and lactic acid dehydrogenase [LDH]: Secondary | ICD-10-CM | POA: Diagnosis not present

## 2017-09-08 ENCOUNTER — Encounter: Payer: Self-pay | Admitting: Gastroenterology

## 2017-10-29 ENCOUNTER — Ambulatory Visit (AMBULATORY_SURGERY_CENTER): Payer: Self-pay | Admitting: *Deleted

## 2017-10-29 ENCOUNTER — Encounter: Payer: Self-pay | Admitting: Gastroenterology

## 2017-10-29 VITALS — Ht 69.0 in | Wt 183.0 lb

## 2017-10-29 DIAGNOSIS — Z1211 Encounter for screening for malignant neoplasm of colon: Secondary | ICD-10-CM

## 2017-10-29 DIAGNOSIS — Z23 Encounter for immunization: Secondary | ICD-10-CM | POA: Diagnosis not present

## 2017-10-29 MED ORDER — NA SULFATE-K SULFATE-MG SULF 17.5-3.13-1.6 GM/177ML PO SOLN: ORAL | 0 refills | Status: DC

## 2017-10-29 NOTE — Progress Notes (Signed)
Registered in [place]  No egg or soy allergy  Pt has never been intubated; denies difficulty moving neck  No home oxygen or hx of sleep apnea  $15 off Suprep coupon given  Work note given

## 2017-11-02 ENCOUNTER — Telehealth: Payer: Self-pay | Admitting: Gastroenterology

## 2017-11-02 NOTE — Telephone Encounter (Signed)
Returned patients call. Patient states that he forgot to tell the nurse at pre-visit that he was taking two over the counter supplements. One was for testosterone and the other was an amino acid supplement. I told Trinity to write down the names and how he was taking them and bring it with him when he comes to his procedure visit and they would be entered into the computer at that time. Patient verbalizes understanding.   Riki Sheer, LPN ( [facility] )

## 2017-11-12 ENCOUNTER — Encounter: Payer: Self-pay | Admitting: Gastroenterology

## 2017-11-12 ENCOUNTER — Ambulatory Visit (AMBULATORY_SURGERY_CENTER): Payer: BLUE CROSS/BLUE SHIELD | Admitting: Gastroenterology

## 2017-11-12 VITALS — BP 123/89 | HR 63 | Temp 98.2°F | Resp 12 | Ht 68.0 in | Wt 184.0 lb

## 2017-11-12 DIAGNOSIS — R748 Abnormal levels of other serum enzymes: Secondary | ICD-10-CM | POA: Diagnosis not present

## 2017-11-12 DIAGNOSIS — Z1211 Encounter for screening for malignant neoplasm of colon: Secondary | ICD-10-CM

## 2017-11-12 DIAGNOSIS — D123 Benign neoplasm of transverse colon: Secondary | ICD-10-CM

## 2017-11-12 DIAGNOSIS — R740 Nonspecific elevation of levels of transaminase and lactic acid dehydrogenase [LDH]: Secondary | ICD-10-CM | POA: Diagnosis not present

## 2017-11-12 DIAGNOSIS — D120 Benign neoplasm of cecum: Secondary | ICD-10-CM

## 2017-11-12 DIAGNOSIS — J4520 Mild intermittent asthma, uncomplicated: Secondary | ICD-10-CM | POA: Diagnosis not present

## 2017-11-12 HISTORY — PX: COLONOSCOPY: SHX174

## 2017-11-12 MED ORDER — SODIUM CHLORIDE 0.9 % IV SOLN
500.0000 mL | Freq: Once | INTRAVENOUS | Status: DC
Start: 2017-11-12 — End: 2017-11-12

## 2017-11-12 NOTE — Progress Notes (Signed)
Called to room to assist during endoscopic procedure.  Patient ID and intended procedure confirmed with present staff. Received instructions for my participation in the procedure from the performing physician.  

## 2017-11-12 NOTE — Progress Notes (Signed)
To PACU, VSS. Report to Rn.tb 

## 2017-11-12 NOTE — Op Note (Signed)
[place] Patient Name: Mike Schmidt Procedure Date: 11/12/2017 8:54 AM MRN: [id number] Endoscopist: Mauri Pole , MD Age: 52 Referring MD:  Date of Birth: 02/10/1966 Gender: Male Account #: [id number] Procedure:                Colonoscopy Indications:              Screening for colorectal malignant neoplasm Medicines:                Monitored Anesthesia Care Procedure:                Pre-Anesthesia Assessment:                           - Prior to the procedure, a History and Physical                            was performed, and patient medications and                            allergies were reviewed. The patient's tolerance of                            previous anesthesia was also reviewed. The risks                            and benefits of the procedure and the sedation                            options and risks were discussed with the patient.                            All questions were answered, and informed consent                            was obtained. Prior Anticoagulants: The patient has                            taken no previous anticoagulant or antiplatelet                            agents. ASA Grade Assessment: II - A patient with                            mild systemic disease. After reviewing the risks                            and benefits, the patient was deemed in                            satisfactory condition to undergo the procedure.                           After obtaining informed consent, the colonoscope  was passed under direct vision. Throughout the                            procedure, the patient's blood pressure, pulse, and                            oxygen saturations were monitored continuously. The                            Model PCF-[id number]) scope was introduced                            through the anus and advanced to the the terminal                            ileum, with  identification of the appendiceal                            orifice and IC valve. The colonoscopy was performed                            without difficulty. The patient tolerated the                            procedure well. The quality of the bowel                            preparation was adequate to identify polyps 6 mm                            and larger in size, could have missed flat polyps.                            The ileocecal valve, appendiceal orifice, and                            rectum were photographed. Scope In: 8:58:59 AM Scope Out: 9:19:07 AM Scope Withdrawal Time: 0 hours 17 minutes 41 seconds  Total Procedure Duration: 0 hours 20 minutes 8 seconds  Findings:                 The perianal and digital rectal examinations were                            normal.                           Two sessile polyps were found in the transverse                            colon. The polyps were 7 to 11 mm in size. These                            polyps were removed with a cold snare. Resection  and retrieval were complete.                           A 14 mm polyp was found in the ileocecal valve. The                            polyp was sessile. The polyp was removed with a                            piecemeal technique using a cold snare. Resection                            and retrieval were complete.                           Non-bleeding internal hemorrhoids were found during                            retroflexion. The hemorrhoids were small. Complications:            No immediate complications. Estimated Blood Loss:     Estimated blood loss was minimal. Impression:               - Two 7 to 11 mm polyps in the transverse colon,                            removed with a cold snare. Resected and retrieved.                           - One 14 mm polyp at the ileocecal valve, removed                            piecemeal using a cold snare.  Resected and                            retrieved.                           - Non-bleeding internal hemorrhoids. Recommendation:           - Patient has a contact number available for                            emergencies. The signs and symptoms of potential                            delayed complications were discussed with the                            patient. Return to normal activities tomorrow.                            Written discharge instructions were provided to the  patient.                           - Resume previous diet.                           - Continue present medications.                           - Await pathology results.                           - Repeat colonoscopy in 1 year because the bowel                            preparation was suboptimal and for surveillance                            after piecemeal polypectomy.                           - For future colonoscopy the patient will require                            an extended preparation. If there are any                            questions, please contact the gastroenterologist. Mauri Pole, MD 11/12/2017 9:24:12 AM This report has been signed electronically.

## 2017-11-12 NOTE — Patient Instructions (Signed)
Handouts given:  Polyps   YOU HAD AN ENDOSCOPIC PROCEDURE TODAY AT THE [facility]:   Refer to the procedure report that was given to you for any specific questions about what was found during the examination.  If the procedure report does not answer your questions, please call your gastroenterologist to clarify.  If you requested that your care partner not be given the details of your procedure findings, then the procedure report has been included in a sealed envelope for you to review at your convenience later.  YOU SHOULD EXPECT: Some feelings of bloating in the abdomen. Passage of more gas than usual.  Walking can help get rid of the air that was put into your GI tract during the procedure and reduce the bloating. If you had a lower endoscopy (such as a colonoscopy or flexible sigmoidoscopy) you may notice spotting of blood in your stool or on the toilet paper. If you underwent a bowel prep for your procedure, you may not have a normal bowel movement for a few days.  Please Note:  You might notice some irritation and congestion in your nose or some drainage.  This is from the oxygen used during your procedure.  There is no need for concern and it should clear up in a day or so.  SYMPTOMS TO REPORT IMMEDIATELY:   Following lower endoscopy (colonoscopy or flexible sigmoidoscopy):  Excessive amounts of blood in the stool  Significant tenderness or worsening of abdominal pains  Swelling of the abdomen that is new, acute  Fever of 100F or higher   For urgent or emergent issues, a gastroenterologist can be reached at any hour by calling [phone].   DIET:  We do recommend a small meal at first, but then you may proceed to your regular diet.  Drink plenty of fluids but you should avoid alcoholic beverages for 24 hours.  ACTIVITY:  You should plan to take it easy for the rest of today and you should NOT DRIVE or use heavy machinery until tomorrow (because of the sedation  medicines used during the test).    FOLLOW UP: Our staff will call the number listed on your records the next business day following your procedure to check on you and address any questions or concerns that you may have regarding the information given to you following your procedure. If we do not reach you, we will leave a message.  However, if you are feeling well and you are not experiencing any problems, there is no need to return our call.  We will assume that you have returned to your regular daily activities without incident.  If any biopsies were taken you will be contacted by phone or by letter within the next 1-3 weeks.  Please call us at [phone] if you have not heard about the biopsies in 3 weeks.    SIGNATURES/CONFIDENTIALITY: You and/or your care partner have signed paperwork which will be entered into your electronic medical record.  These signatures attest to the fact that that the information above on your After Visit Summary has been reviewed and is understood.  Full responsibility of the confidentiality of this discharge information lies with you and/or your care-partner. 

## 2017-11-15 ENCOUNTER — Telehealth: Payer: Self-pay | Admitting: *Deleted

## 2017-11-15 ENCOUNTER — Telehealth: Payer: Self-pay

## 2017-11-15 NOTE — Telephone Encounter (Signed)
Second follow up call attempt. Voicemail with phone number identifier.  Message left to call if any questions or concerns.

## 2017-11-15 NOTE — Telephone Encounter (Signed)
Number identifier, left a voicemail. 

## 2017-11-24 ENCOUNTER — Encounter: Payer: Self-pay | Admitting: Gastroenterology

## 2018-04-09 IMAGING — DX DG FOREARM 2V*L*
2 series · 2 of 2 positions shown · non-contrast
Comparison: None.

CLINICAL DATA: Patient was at work today handling a piece of [employer] [place]
and had a splinter stuck in the medial left forearm.

EXAM:
LEFT FOREARM - 2 VIEW

[forearm ap]
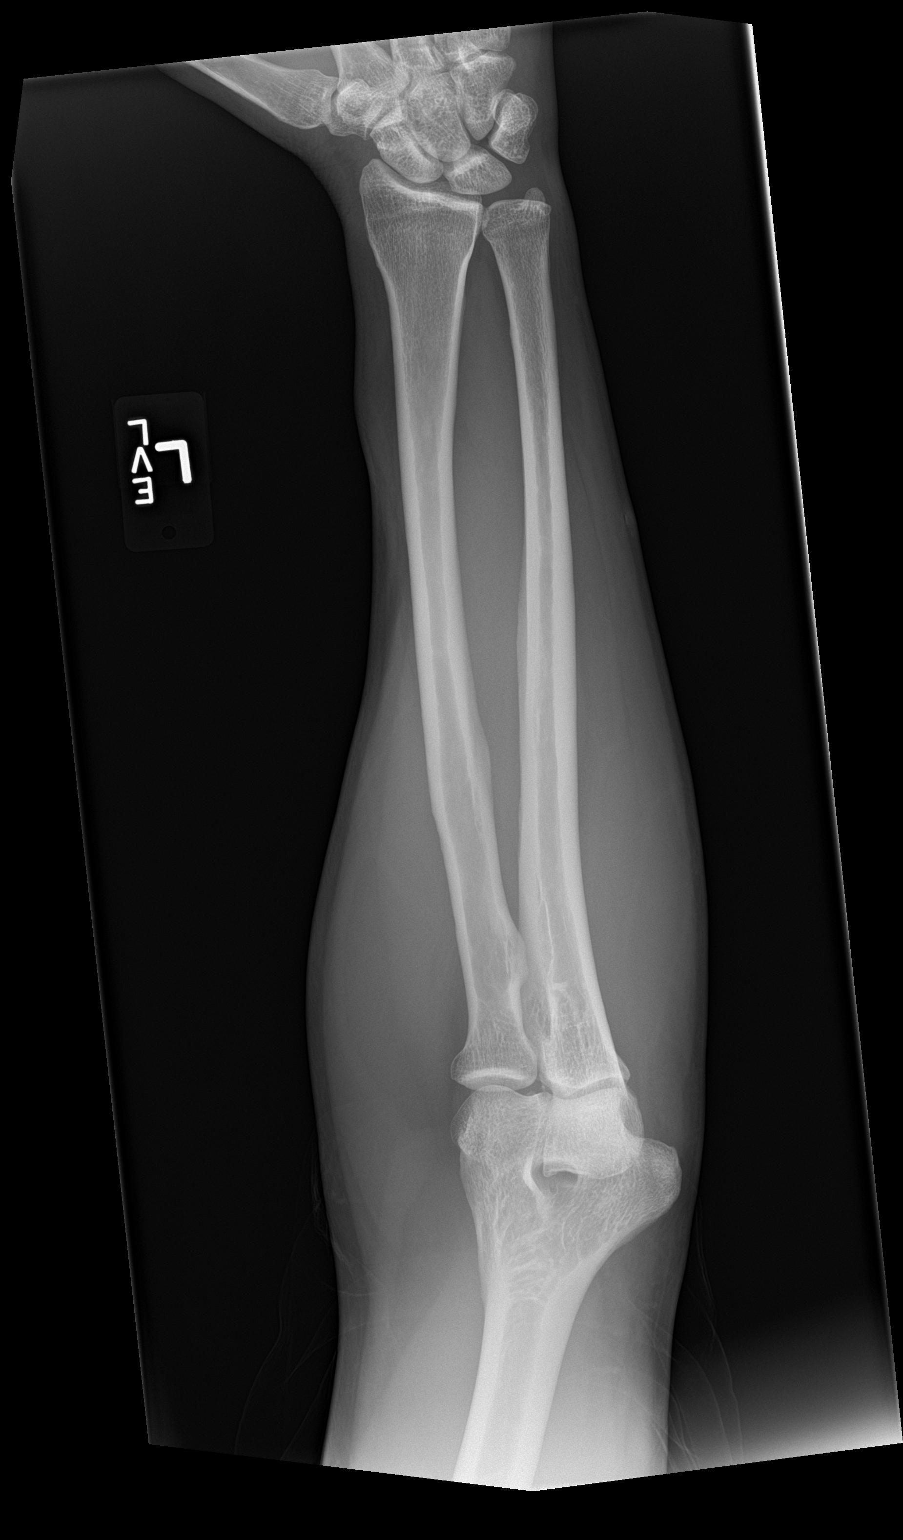

[forearm lat]
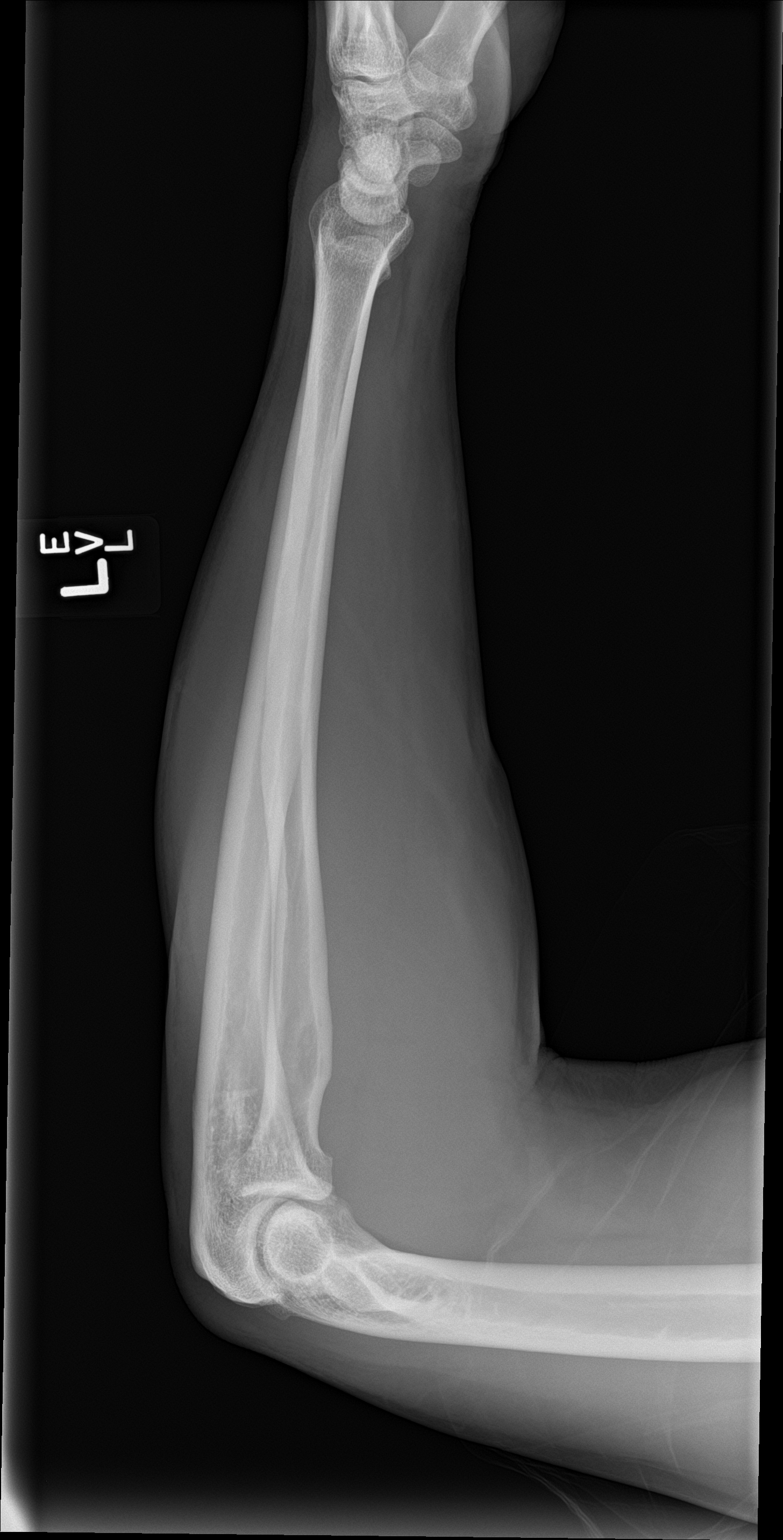

[2 of 2 positions shown; findings below may reference images not displayed]

FINDINGS: The punctate subcutaneous soft tissue density along the medial
aspect of the left forearm at the junction of the middle and distal
[DATE] represent a tiny foreign body measuring approximate 3 mm
in length on the AP view. It is not well visualized on the lateral
projection. No acute osseous abnormality is seen.
IMPRESSION: Along the medial aspect of the distal left forearm is a 3 mm in
length subcutaneous soft tissue density suspicious for a radiopaque
foreign body. This is best seen on the AP view at the junction of
the middle and distal third. No acute osseous abnormality.

## 2018-08-02 DIAGNOSIS — Z20828 Contact with and (suspected) exposure to other viral communicable diseases: Secondary | ICD-10-CM | POA: Diagnosis not present

## 2018-09-28 ENCOUNTER — Encounter: Payer: Self-pay | Admitting: Gastroenterology

## 2018-10-14 DIAGNOSIS — E559 Vitamin D deficiency, unspecified: Secondary | ICD-10-CM | POA: Diagnosis not present

## 2018-10-14 DIAGNOSIS — R748 Abnormal levels of other serum enzymes: Secondary | ICD-10-CM | POA: Diagnosis not present

## 2018-10-14 DIAGNOSIS — Z0000 Encounter for general adult medical examination without abnormal findings: Secondary | ICD-10-CM | POA: Diagnosis not present

## 2018-10-21 DIAGNOSIS — J4520 Mild intermittent asthma, uncomplicated: Secondary | ICD-10-CM | POA: Diagnosis not present

## 2018-10-21 DIAGNOSIS — R748 Abnormal levels of other serum enzymes: Secondary | ICD-10-CM | POA: Diagnosis not present

## 2018-10-21 DIAGNOSIS — Z0000 Encounter for general adult medical examination without abnormal findings: Secondary | ICD-10-CM | POA: Diagnosis not present

## 2018-10-21 DIAGNOSIS — R7301 Impaired fasting glucose: Secondary | ICD-10-CM | POA: Diagnosis not present

## 2018-11-04 ENCOUNTER — Ambulatory Visit (AMBULATORY_SURGERY_CENTER): Payer: Self-pay | Admitting: *Deleted

## 2018-11-04 ENCOUNTER — Other Ambulatory Visit: Payer: Self-pay

## 2018-11-04 ENCOUNTER — Encounter: Payer: Self-pay | Admitting: Gastroenterology

## 2018-11-04 VITALS — Temp 97.5°F | Ht 68.0 in | Wt 197.0 lb

## 2018-11-04 DIAGNOSIS — Z86010 Personal history of colonic polyps: Secondary | ICD-10-CM

## 2018-11-04 MED ORDER — NA SULFATE-K SULFATE-MG SULF 17.5-3.13-1.6 GM/177ML PO SOLN: ORAL | 0 refills | Status: DC

## 2018-11-04 NOTE — Progress Notes (Signed)
Patient is here in-person for PV. Patient denies any allergies to eggs or soy. Patient denies any problems with anesthesia/sedation. Patient denies any oxygen use at home. Patient denies taking any diet/weight loss medications or blood thinners. EMMI education assisgned to patient on colonoscopy, this was explained and instructions given to patient.Pt is aware that care partner will wait in the car during procedure; if they feel like they will be too hot to wait in the car; they may wait in the lobby.  We want them to wear a mask (we do not have any that we can provide them), practice social distancing, and we will check their temperatures when they get here.  I did remind patient that their care partner needs to stay in the parking lot the entire time. Pt will wear mask into building. Suprep coupon given to pt.

## 2018-11-17 ENCOUNTER — Telehealth: Payer: Self-pay

## 2018-11-17 NOTE — Telephone Encounter (Signed)
Patient called back and answered "NO" to all screening questions. °

## 2018-11-17 NOTE — Telephone Encounter (Signed)
Covid-19 screening questions   Do you now or have you had a fever in the last 14 days?  Do you have any respiratory symptoms of shortness of breath or cough now or in the last 14 days?  Do you have any family members or close contacts with diagnosed or suspected Covid-19 in the past 14 days?  Have you been tested for Covid-19 and found to be positive?       

## 2018-11-18 ENCOUNTER — Other Ambulatory Visit: Payer: Self-pay

## 2018-11-18 ENCOUNTER — Encounter: Payer: Self-pay | Admitting: Gastroenterology

## 2018-11-18 ENCOUNTER — Ambulatory Visit (AMBULATORY_SURGERY_CENTER): Payer: BC Managed Care – PPO | Admitting: Gastroenterology

## 2018-11-18 VITALS — BP 120/80 | HR 66 | Temp 98.5°F | Resp 17 | Ht 68.0 in | Wt 197.0 lb

## 2018-11-18 DIAGNOSIS — K621 Rectal polyp: Secondary | ICD-10-CM

## 2018-11-18 DIAGNOSIS — Z1211 Encounter for screening for malignant neoplasm of colon: Secondary | ICD-10-CM | POA: Diagnosis not present

## 2018-11-18 DIAGNOSIS — D120 Benign neoplasm of cecum: Secondary | ICD-10-CM | POA: Diagnosis not present

## 2018-11-18 DIAGNOSIS — D128 Benign neoplasm of rectum: Secondary | ICD-10-CM

## 2018-11-18 DIAGNOSIS — Z86010 Personal history of colonic polyps: Secondary | ICD-10-CM

## 2018-11-18 MED ORDER — SODIUM CHLORIDE 0.9 % IV SOLN: 500.0000 mL | Freq: Once | INTRAVENOUS | Status: DC

## 2018-11-18 NOTE — Progress Notes (Signed)
A and O x3. Report to RN. Tolerated MAC anesthesia well.

## 2018-11-18 NOTE — Progress Notes (Signed)
Temperature- Karina Athens  VS- Courtney Washington  Pt's states no medical or surgical changes since previsit or office visit.  

## 2018-11-18 NOTE — Patient Instructions (Signed)
Impression/Recommendations:  Polyp handout given to patient. Diverticulosis handout given to patient.  Resume previous diet. Continue present medications.  Await pathology results.  Repeat colonoscopy in 3 years for surveillance based on pathology results.  YOU HAD AN ENDOSCOPIC PROCEDURE TODAY AT [facility]:   Refer to the procedure report that was given to you for any specific questions about what was found during the examination.  If the procedure report does not answer your questions, please call your gastroenterologist to clarify.  If you requested that your care partner not be given the details of your procedure findings, then the procedure report has been included in a sealed envelope for you to review at your convenience later.  YOU SHOULD EXPECT: Some feelings of bloating in the abdomen. Passage of more gas than usual.  Walking can help get rid of the air that was put into your GI tract during the procedure and reduce the bloating. If you had a lower endoscopy (such as a colonoscopy or flexible sigmoidoscopy) you may notice spotting of blood in your stool or on the toilet paper. If you underwent a bowel prep for your procedure, you may not have a normal bowel movement for a few days.  Please Note:  You might notice some irritation and congestion in your nose or some drainage.  This is from the oxygen used during your procedure.  There is no need for concern and it should clear up in a day or so.  SYMPTOMS TO REPORT IMMEDIATELY:   Following lower endoscopy (colonoscopy or flexible sigmoidoscopy):  Excessive amounts of blood in the stool  Significant tenderness or worsening of abdominal pains  Swelling of the abdomen that is new, acute  Fever of 100F or higher For urgent or emergent issues, a gastroenterologist can be reached at any hour by calling [phone].   DIET:  We do recommend a small meal at first, but then you may proceed to your regular diet.   Drink plenty of fluids but you should avoid alcoholic beverages for 24 hours.  ACTIVITY:  You should plan to take it easy for the rest of today and you should NOT DRIVE or use heavy machinery until tomorrow (because of the sedation medicines used during the test).    FOLLOW UP: Our staff will call the number listed on your records 48-72 hours following your procedure to check on you and address any questions or concerns that you may have regarding the information given to you following your procedure. If we do not reach you, we will leave a message.  We will attempt to reach you two times.  During this call, we will ask if you have developed any symptoms of COVID 19. If you develop any symptoms (ie: fever, flu-like symptoms, shortness of breath, cough etc.) before then, please call [phone].  If you test positive for Covid 19 in the 2 weeks post procedure, please call and report this information to [place].    If any biopsies were taken you will be contacted by phone or by letter within the next 1-3 weeks.  Please call us at [phone] if you have not heard about the biopsies in 3 weeks.    SIGNATURES/CONFIDENTIALITY: You and/or your care partner have signed paperwork which will be entered into your electronic medical record.  These signatures attest to the fact that that the information above on your After Visit Summary has been reviewed and is understood.  Full responsibility of the confidentiality of this discharge  information lies with you and/or your care-partner. 

## 2018-11-18 NOTE — Op Note (Signed)
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 The patient's tolerance of                            previous anesthesia was also reviewed. The risks                            and benefits of the procedure and the sedation                            options and risks were discussed with the patient.                            All questions were answered, and informed consent                            was obtained. Prior Anticoagulants: The patient has                            taken no previous anticoagulant or antiplatelet                            agents. ASA Grade Assessment: II - A patient with  mild systemic disease. After reviewing the risks                            and benefits, the patient was deemed in                            satisfactory condition to undergo the procedure.                           After obtaining informed consent, the colonoscope                            was passed under direct vision. Throughout the                            procedure, the  patient's blood pressure, pulse, and                            oxygen saturations were monitored continuously. The                            Colonoscope was introduced through the anus and                            advanced to the the terminal ileum, with                            identification of the appendiceal orifice and IC                            valve. The colonoscopy was performed without                            difficulty. The patient tolerated the procedure                            well. The quality of the bowel preparation was                            adequate. The ileocecal valve, appendiceal orifice,                            and rectum were photographed. Scope In: 10:01:53 AM Scope Out: 10:29:39 AM Scope Withdrawal Time: 0 hours 25 minutes 35 seconds  Total Procedure Duration: 0 hours 27 minutes 46 seconds  Findings:                 The perianal and digital rectal examinations were                            normal.                           Two sessile polyps were found in the cecum and  ileocecal valve. The polyps were 4 to 10 mm in                            size. These polyps were removed with a cold snare.                            Resection and retrieval were complete.                           Two sessile polyps were found in the rectum. The                            polyps were 1 to 2 mm in size. These polyps were                            removed with a cold biopsy forceps. Resection and                            retrieval were complete.                           A few small-mouthed diverticula were found in the                            sigmoid colon.                           The exam was otherwise without abnormality. Complications:            No immediate complications. Estimated Blood Loss:     Estimated blood loss was minimal. Impression:               - Two 4 to 10 mm polyps in the cecum and at the                             ileocecal valve, removed with a cold snare.                            Resected and retrieved.                           - Two 1 to 2 mm polyps in the rectum, removed with                            a cold biopsy forceps. Resected and retrieved.                           - Diverticulosis in the sigmoid colon.                           - The examination was otherwise normal. Recommendation:           - Patient has a contact number available for  emergencies. The signs and symptoms of potential                            delayed complications were discussed with the                            patient. Return to normal activities tomorrow.                            Written discharge instructions were provided to the                            patient.                           - Resume previous diet.                           - Continue present medications.                           - Await pathology results.                           - Repeat colonoscopy in 3 years for surveillance                            based on pathology results. Mauri Pole, MD 11/18/2018 10:35:29 AM This report has been signed electronically.

## 2018-11-18 NOTE — Progress Notes (Signed)
Called to room to assist during endoscopic procedure.  Patient ID and intended procedure confirmed with present staff. Received instructions for my participation in the procedure from the performing physician.  

## 2018-11-22 ENCOUNTER — Telehealth: Payer: Self-pay

## 2018-11-22 NOTE — Telephone Encounter (Signed)
Called [phone] and left a messaged we tried to reach pt for a follow up call. maw

## 2018-11-22 NOTE — Telephone Encounter (Signed)
First attempt follow up call to pt, LM on VM

## 2018-11-25 ENCOUNTER — Encounter: Payer: Self-pay | Admitting: Gastroenterology

## 2019-01-05 DIAGNOSIS — M25571 Pain in right ankle and joints of right foot: Secondary | ICD-10-CM | POA: Diagnosis not present

## 2021-02-13 ENCOUNTER — Inpatient Hospital Stay (HOSPITAL_COMMUNITY)
Admission: EM | Admit: 2021-02-13 | Discharge: 2021-02-15 | DRG: 247 | Disposition: A | Discharge status: Home / Self Care | Payer: BC Managed Care – PPO | Attending: Cardiology | Admitting: Cardiology

## 2021-02-13 ENCOUNTER — Other Ambulatory Visit: Payer: Self-pay

## 2021-02-13 ENCOUNTER — Emergency Department (HOSPITAL_COMMUNITY): Payer: BC Managed Care – PPO

## 2021-02-13 DIAGNOSIS — Z955 Presence of coronary angioplasty implant and graft: Secondary | ICD-10-CM

## 2021-02-13 DIAGNOSIS — I10 Essential (primary) hypertension: Secondary | ICD-10-CM | POA: Diagnosis present

## 2021-02-13 DIAGNOSIS — R7401 Elevation of levels of liver transaminase levels: Secondary | ICD-10-CM | POA: Diagnosis not present

## 2021-02-13 DIAGNOSIS — I214 Non-ST elevation (NSTEMI) myocardial infarction: Secondary | ICD-10-CM | POA: Diagnosis not present

## 2021-02-13 DIAGNOSIS — R0789 Other chest pain: Secondary | ICD-10-CM | POA: Diagnosis not present

## 2021-02-13 DIAGNOSIS — Z7951 Long term (current) use of inhaled steroids: Secondary | ICD-10-CM | POA: Diagnosis not present

## 2021-02-13 DIAGNOSIS — M47817 Spondylosis without myelopathy or radiculopathy, lumbosacral region: Secondary | ICD-10-CM | POA: Diagnosis not present

## 2021-02-13 DIAGNOSIS — J45909 Unspecified asthma, uncomplicated: Secondary | ICD-10-CM | POA: Diagnosis not present

## 2021-02-13 DIAGNOSIS — E785 Hyperlipidemia, unspecified: Secondary | ICD-10-CM | POA: Diagnosis not present

## 2021-02-13 DIAGNOSIS — I2510 Atherosclerotic heart disease of native coronary artery without angina pectoris: Secondary | ICD-10-CM | POA: Diagnosis not present

## 2021-02-13 DIAGNOSIS — Z20822 Contact with and (suspected) exposure to covid-19: Secondary | ICD-10-CM | POA: Diagnosis not present

## 2021-02-13 DIAGNOSIS — R7303 Prediabetes: Secondary | ICD-10-CM | POA: Diagnosis not present

## 2021-02-13 DIAGNOSIS — R748 Abnormal levels of other serum enzymes: Secondary | ICD-10-CM

## 2021-02-13 DIAGNOSIS — K5730 Diverticulosis of large intestine without perforation or abscess without bleeding: Secondary | ICD-10-CM | POA: Diagnosis not present

## 2021-02-13 DIAGNOSIS — R7989 Other specified abnormal findings of blood chemistry: Secondary | ICD-10-CM | POA: Diagnosis not present

## 2021-02-13 DIAGNOSIS — R739 Hyperglycemia, unspecified: Secondary | ICD-10-CM | POA: Diagnosis not present

## 2021-02-13 DIAGNOSIS — R231 Pallor: Secondary | ICD-10-CM | POA: Diagnosis not present

## 2021-02-13 DIAGNOSIS — Z79899 Other long term (current) drug therapy: Secondary | ICD-10-CM | POA: Diagnosis not present

## 2021-02-13 DIAGNOSIS — Z87891 Personal history of nicotine dependence: Secondary | ICD-10-CM | POA: Diagnosis not present

## 2021-02-13 DIAGNOSIS — R7309 Other abnormal glucose: Secondary | ICD-10-CM

## 2021-02-13 DIAGNOSIS — R030 Elevated blood-pressure reading, without diagnosis of hypertension: Secondary | ICD-10-CM

## 2021-02-13 DIAGNOSIS — R079 Chest pain, unspecified: Secondary | ICD-10-CM | POA: Diagnosis not present

## 2021-02-13 DIAGNOSIS — Z833 Family history of diabetes mellitus: Secondary | ICD-10-CM | POA: Diagnosis not present

## 2021-02-13 DIAGNOSIS — Z8249 Family history of ischemic heart disease and other diseases of the circulatory system: Secondary | ICD-10-CM | POA: Diagnosis not present

## 2021-02-13 DIAGNOSIS — J9811 Atelectasis: Secondary | ICD-10-CM | POA: Diagnosis not present

## 2021-02-13 DIAGNOSIS — Q278 Other specified congenital malformations of peripheral vascular system: Secondary | ICD-10-CM | POA: Diagnosis not present

## 2021-02-13 NOTE — ED Triage Notes (Signed)
Pt c/o left sided chest pain that woke him out of sleep tonight.

## 2021-02-13 NOTE — ED Provider Notes (Signed)
Emergency Medicine Provider Triage Evaluation Note  CHRISTOPHER GLASSCOCK , a 55 y.o. male  was evaluated in triage.  Pt complains of acute onset chest pain approximately 30 minutes prior to arrival.  EMS reports patient was hypertensive, diaphoretic and had several episodes of vomiting.  Denies previous medical history.  Wife gave aspirin prior to arrival however patient vomited.  EMS attempted to give nitro and patient again vomited.  He was given Zofran by EMS.  Denies previous cardiac history.  Denies aggravating or alleviating factors.  Patient denies recent illness but does state he has had a cough for several days.  Review of Systems  Positive: Chest pain, diaphoresis, vomiting Negative: Fever, chills  Physical Exam  BP (!) 159/103 (BP Location: Right Arm)    Pulse 75    Temp 98.4 F (36.9 C) (Oral)    Resp (!) 24    Ht 5\' 8"  (1.727 m)    Wt 83.9 kg    SpO2 95%    BMI 28.13 kg/m  Gen:   Awake, pale, diaphoretic, writhing in pain Resp:  Normal effort  MSK:   Moves extremities without difficulty  Other:  Abdomen soft, nontender  Medical Decision Making  Medically screening exam initiated at 11:33 PM.  Appropriate orders placed.  Nelda Marseille was informed that the remainder of the evaluation will be completed by another provider, this initial triage assessment does not replace that evaluation, and the importance of remaining in the ED until their evaluation is complete.  Chest pain, concern for possible aortic dissection.  Labs and CT ordered.   Margert Edsall, Gwenlyn Perking 02/13/21 [id number]    Veryl Speak, MD 02/14/21 [id number]

## 2021-02-14 ENCOUNTER — Encounter (HOSPITAL_COMMUNITY)
Admission: EM | Disposition: A | Discharge status: Home / Self Care | Payer: Self-pay | Source: Home / Self Care | Attending: Cardiology

## 2021-02-14 ENCOUNTER — Other Ambulatory Visit (HOSPITAL_COMMUNITY): Payer: Self-pay

## 2021-02-14 ENCOUNTER — Inpatient Hospital Stay (HOSPITAL_COMMUNITY): Payer: BC Managed Care – PPO

## 2021-02-14 ENCOUNTER — Emergency Department (HOSPITAL_COMMUNITY): Payer: BC Managed Care – PPO

## 2021-02-14 ENCOUNTER — Encounter (HOSPITAL_COMMUNITY): Payer: Self-pay | Admitting: Student

## 2021-02-14 DIAGNOSIS — R7303 Prediabetes: Secondary | ICD-10-CM | POA: Diagnosis present

## 2021-02-14 DIAGNOSIS — Z20822 Contact with and (suspected) exposure to covid-19: Secondary | ICD-10-CM | POA: Diagnosis not present

## 2021-02-14 DIAGNOSIS — Z7951 Long term (current) use of inhaled steroids: Secondary | ICD-10-CM | POA: Diagnosis not present

## 2021-02-14 DIAGNOSIS — Z79899 Other long term (current) drug therapy: Secondary | ICD-10-CM | POA: Diagnosis not present

## 2021-02-14 DIAGNOSIS — I2510 Atherosclerotic heart disease of native coronary artery without angina pectoris: Secondary | ICD-10-CM | POA: Diagnosis present

## 2021-02-14 DIAGNOSIS — Z87891 Personal history of nicotine dependence: Secondary | ICD-10-CM | POA: Diagnosis not present

## 2021-02-14 DIAGNOSIS — I10 Essential (primary) hypertension: Secondary | ICD-10-CM

## 2021-02-14 DIAGNOSIS — I214 Non-ST elevation (NSTEMI) myocardial infarction: Principal | ICD-10-CM | POA: Diagnosis present

## 2021-02-14 DIAGNOSIS — R030 Elevated blood-pressure reading, without diagnosis of hypertension: Secondary | ICD-10-CM | POA: Diagnosis present

## 2021-02-14 DIAGNOSIS — R7989 Other specified abnormal findings of blood chemistry: Secondary | ICD-10-CM

## 2021-02-14 DIAGNOSIS — Z833 Family history of diabetes mellitus: Secondary | ICD-10-CM | POA: Diagnosis not present

## 2021-02-14 DIAGNOSIS — Z8249 Family history of ischemic heart disease and other diseases of the circulatory system: Secondary | ICD-10-CM | POA: Diagnosis not present

## 2021-02-14 DIAGNOSIS — E785 Hyperlipidemia, unspecified: Secondary | ICD-10-CM | POA: Diagnosis not present

## 2021-02-14 DIAGNOSIS — R079 Chest pain, unspecified: Secondary | ICD-10-CM

## 2021-02-14 DIAGNOSIS — J45909 Unspecified asthma, uncomplicated: Secondary | ICD-10-CM | POA: Diagnosis present

## 2021-02-14 HISTORY — PX: LEFT HEART CATH AND CORONARY ANGIOGRAPHY: CATH118249

## 2021-02-14 HISTORY — PX: CORONARY STENT INTERVENTION: CATH118234

## 2021-02-14 LAB — RESP PANEL BY RT-PCR (FLU A&B, COVID) ARPGX2
Influenza A by PCR: NEGATIVE
Influenza B by PCR: NEGATIVE
SARS Coronavirus 2 by RT PCR: NEGATIVE

## 2021-02-14 LAB — COMPREHENSIVE METABOLIC PANEL
ALT: 69 U/L — ABNORMAL HIGH (ref 0–44)
AST: 39 U/L (ref 15–41)
Albumin: 3.9 g/dL (ref 3.5–5.0)
Alkaline Phosphatase: 59 U/L (ref 38–126)
Anion gap: 9 (ref 5–15)
BUN: 19 mg/dL (ref 6–20)
CO2: 23 mmol/L (ref 22–32)
Calcium: 8.6 mg/dL — ABNORMAL LOW (ref 8.9–10.3)
Chloride: 106 mmol/L (ref 98–111)
Creatinine, Ser: 1.02 mg/dL (ref 0.61–1.24)
GFR, Estimated: >60 mL/min (ref >60)
Glucose, Bld: 162 mg/dL — ABNORMAL HIGH (ref 70–99)
Potassium: 3.7 mmol/L (ref 3.5–5.1)
Sodium: 138 mmol/L (ref 135–145)
Total Bilirubin: 0.4 mg/dL (ref 0.3–1.2)
Total Protein: 6.4 g/dL — ABNORMAL LOW (ref 6.5–8.1)

## 2021-02-14 LAB — BRAIN NATRIURETIC PEPTIDE: B Natriuretic Peptide: 26.8 pg/mL (ref 0.0–100.0)

## 2021-02-14 LAB — CBC
HCT: 45.1 % (ref 39.0–52.0)
HCT: 46.9 % (ref 39.0–52.0)
Hemoglobin: 15.1 g/dL (ref 13.0–17.0)
Hemoglobin: 15.6 g/dL (ref 13.0–17.0)
MCH: 29.7 pg (ref 26.0–34.0)
MCH: 29.8 pg (ref 26.0–34.0)
MCHC: 33.3 g/dL (ref 30.0–36.0)
MCHC: 33.5 g/dL (ref 30.0–36.0)
MCV: 88.8 fL (ref 80.0–100.0)
MCV: 89.5 fL (ref 80.0–100.0)
Platelets: 248 10*3/uL (ref 150–400)
Platelets: 250 10*3/uL (ref 150–400)
RBC: 5.08 MIL/uL (ref 4.22–5.81)
RBC: 5.24 MIL/uL (ref 4.22–5.81)
RDW: 13.3 % (ref 11.5–15.5)
RDW: 13.6 % (ref 11.5–15.5)
WBC: 7.4 10*3/uL (ref 4.0–10.5)
WBC: 8.9 10*3/uL (ref 4.0–10.5)
nRBC: 0 % (ref 0.0–0.2)
nRBC: 0 % (ref 0.0–0.2)

## 2021-02-14 LAB — BASIC METABOLIC PANEL
Anion gap: 8 (ref 5–15)
BUN: 16 mg/dL (ref 6–20)
CO2: 24 mmol/L (ref 22–32)
Calcium: 8.2 mg/dL — ABNORMAL LOW (ref 8.9–10.3)
Chloride: 103 mmol/L (ref 98–111)
Creatinine, Ser: 0.97 mg/dL (ref 0.61–1.24)
GFR, Estimated: >60 mL/min (ref >60)
Glucose, Bld: 134 mg/dL — ABNORMAL HIGH (ref 70–99)
Potassium: 4.1 mmol/L (ref 3.5–5.1)
Sodium: 135 mmol/L (ref 135–145)

## 2021-02-14 LAB — HIV ANTIBODY (ROUTINE TESTING W REFLEX): HIV Screen 4th Generation wRfx: NONREACTIVE

## 2021-02-14 LAB — POCT ACTIVATED CLOTTING TIME: Activated Clotting Time: 420 seconds

## 2021-02-14 LAB — LIPID PANEL
Cholesterol: 158 mg/dL (ref 0–200)
HDL: 40 mg/dL — ABNORMAL LOW (ref >40)
LDL Cholesterol: 100 mg/dL — ABNORMAL HIGH (ref 0–99)
Total CHOL/HDL Ratio: 4 RATIO
Triglycerides: 92 mg/dL (ref <150)
VLDL: 18 mg/dL (ref 0–40)

## 2021-02-14 LAB — ECHOCARDIOGRAM COMPLETE
AR max vel: 2.63 cm2
AV Area VTI: 2.85 cm2
AV Area mean vel: 2.73 cm2
AV Mean grad: 2 mmHg
AV Peak grad: 3 mmHg
Ao pk vel: 0.86 m/s
Area-P 1/2: 5.16 cm2
Height: 68 in
S' Lateral: 3 cm
Weight: 2960 oz

## 2021-02-14 LAB — TROPONIN I (HIGH SENSITIVITY)
Troponin I (High Sensitivity): 779 ng/L (ref <18)
Troponin I (High Sensitivity): 95 ng/L — ABNORMAL HIGH (ref <18)

## 2021-02-14 LAB — LIPASE, BLOOD: Lipase: 189 U/L — ABNORMAL HIGH (ref 11–51)

## 2021-02-14 LAB — TSH: TSH: 1.915 u[IU]/mL (ref 0.350–4.500)

## 2021-02-14 LAB — HEMOGLOBIN A1C
Hgb A1c MFr Bld: 6.8 % — ABNORMAL HIGH (ref 4.8–5.6)
Mean Plasma Glucose: 148.46 mg/dL

## 2021-02-14 SURGERY — LEFT HEART CATH AND CORONARY ANGIOGRAPHY
Anesthesia: LOCAL

## 2021-02-14 MED ORDER — SODIUM CHLORIDE 0.9 % IV SOLN
INTRAVENOUS | Status: AC | PRN
  Administered 2021-02-14: 100 mL/h via INTRAVENOUS

## 2021-02-14 MED ORDER — ASPIRIN 81 MG PO CHEW
324.0000 mg | CHEWABLE_TABLET | Freq: Once | ORAL | Status: AC
  Administered 2021-02-14: 03:00:00 324 mg via ORAL
  Filled 2021-02-14: qty 4

## 2021-02-14 MED ORDER — HEPARIN SODIUM (PORCINE) 5000 UNIT/ML IJ SOLN
5000.0000 [IU] | Freq: Three times a day (TID) | INTRAMUSCULAR | Status: DC
  Administered 2021-02-14 – 2021-02-15 (×2): 5000 [IU] via SUBCUTANEOUS
  Filled 2021-02-14 (×2): qty 1

## 2021-02-14 MED ORDER — MOMETASONE FURO-FORMOTEROL FUM 200-5 MCG/ACT IN AERO
2.0000 | INHALATION_SPRAY | Freq: Two times a day (BID) | RESPIRATORY_TRACT | Status: DC
  Administered 2021-02-14 – 2021-02-15 (×2): 2 via RESPIRATORY_TRACT
  Filled 2021-02-14: qty 8.8

## 2021-02-14 MED ORDER — LABETALOL HCL 5 MG/ML IV SOLN: 10.0000 mg | INTRAVENOUS | Status: AC | PRN

## 2021-02-14 MED ORDER — MIDAZOLAM HCL 2 MG/2ML IJ SOLN
INTRAMUSCULAR | Status: AC
  Filled 2021-02-14: qty 2

## 2021-02-14 MED ORDER — NITROGLYCERIN 0.4 MG SL SUBL: 0.4000 mg | SUBLINGUAL_TABLET | SUBLINGUAL | Status: DC | PRN

## 2021-02-14 MED ORDER — LIDOCAINE HCL (PF) 1 % IJ SOLN
INTRAMUSCULAR | Status: AC
  Filled 2021-02-14: qty 30

## 2021-02-14 MED ORDER — IOHEXOL 350 MG/ML SOLN
100.0000 mL | Freq: Once | INTRAVENOUS | Status: AC | PRN
  Administered 2021-02-14: 02:00:00 100 mL via INTRAVENOUS

## 2021-02-14 MED ORDER — SODIUM CHLORIDE 0.9 % WEIGHT BASED INFUSION: 1.0000 mL/kg/h | INTRAVENOUS | Status: DC

## 2021-02-14 MED ORDER — MIDAZOLAM HCL 2 MG/2ML IJ SOLN
INTRAMUSCULAR | Status: DC | PRN
  Administered 2021-02-14: 2 mg via INTRAVENOUS

## 2021-02-14 MED ORDER — SODIUM CHLORIDE 0.9 % WEIGHT BASED INFUSION
1.0000 mL/kg/h | INTRAVENOUS | Status: AC
  Administered 2021-02-14: 11:00:00 1 mL/kg/h via INTRAVENOUS

## 2021-02-14 MED ORDER — TICAGRELOR 90 MG PO TABS
ORAL_TABLET | ORAL | Status: AC
  Filled 2021-02-14: qty 1

## 2021-02-14 MED ORDER — CARVEDILOL 6.25 MG PO TABS
6.2500 mg | ORAL_TABLET | Freq: Two times a day (BID) | ORAL | Status: DC
  Administered 2021-02-14 – 2021-02-15 (×3): 6.25 mg via ORAL
  Filled 2021-02-14: qty 2
  Filled 2021-02-14 (×2): qty 1

## 2021-02-14 MED ORDER — HYDRALAZINE HCL 20 MG/ML IJ SOLN: 10.0000 mg | INTRAMUSCULAR | Status: AC | PRN

## 2021-02-14 MED ORDER — VERAPAMIL HCL 2.5 MG/ML IV SOLN
INTRAVENOUS | Status: AC
  Filled 2021-02-14: qty 2

## 2021-02-14 MED ORDER — VALACYCLOVIR HCL 500 MG PO TABS
1000.0000 mg | ORAL_TABLET | Freq: Every day | ORAL | Status: DC
  Administered 2021-02-14 – 2021-02-15 (×2): 1000 mg via ORAL
  Filled 2021-02-14 (×2): qty 2

## 2021-02-14 MED ORDER — ONDANSETRON HCL 4 MG/2ML IJ SOLN
4.0000 mg | Freq: Four times a day (QID) | INTRAMUSCULAR | Status: DC | PRN
  Administered 2021-02-14: 14:00:00 4 mg via INTRAVENOUS
  Filled 2021-02-14: qty 2

## 2021-02-14 MED ORDER — SODIUM CHLORIDE 0.9 % IV SOLN: 250.0000 mL | INTRAVENOUS | Status: DC | PRN

## 2021-02-14 MED ORDER — LORATADINE 10 MG PO TABS
10.0000 mg | ORAL_TABLET | Freq: Every day | ORAL | Status: DC
  Administered 2021-02-14 – 2021-02-15 (×2): 10 mg via ORAL
  Filled 2021-02-14 (×2): qty 1

## 2021-02-14 MED ORDER — HEPARIN BOLUS VIA INFUSION
4000.0000 [IU] | Freq: Once | INTRAVENOUS | Status: AC
  Administered 2021-02-14: 04:00:00 4000 [IU] via INTRAVENOUS
  Filled 2021-02-14: qty 4000

## 2021-02-14 MED ORDER — TICAGRELOR 90 MG PO TABS
ORAL_TABLET | ORAL | Status: DC | PRN
  Administered 2021-02-14: 180 mg via ORAL

## 2021-02-14 MED ORDER — HEPARIN SODIUM (PORCINE) 1000 UNIT/ML IJ SOLN
INTRAMUSCULAR | Status: DC | PRN
  Administered 2021-02-14 (×2): 4500 [IU] via INTRAVENOUS

## 2021-02-14 MED ORDER — HEPARIN SODIUM (PORCINE) 1000 UNIT/ML IJ SOLN
INTRAMUSCULAR | Status: AC
  Filled 2021-02-14: qty 10

## 2021-02-14 MED ORDER — FENTANYL CITRATE (PF) 100 MCG/2ML IJ SOLN
INTRAMUSCULAR | Status: AC
  Filled 2021-02-14: qty 2

## 2021-02-14 MED ORDER — ROSUVASTATIN CALCIUM 20 MG PO TABS
40.0000 mg | ORAL_TABLET | Freq: Every day | ORAL | Status: DC
Start: 2021-02-14 — End: 2021-02-15
  Administered 2021-02-14 – 2021-02-15 (×2): 40 mg via ORAL
  Filled 2021-02-14 (×2): qty 2

## 2021-02-14 MED ORDER — SODIUM CHLORIDE 0.9 % WEIGHT BASED INFUSION: 3.0000 mL/kg/h | INTRAVENOUS | Status: DC

## 2021-02-14 MED ORDER — SODIUM CHLORIDE 0.9% FLUSH
3.0000 mL | Freq: Two times a day (BID) | INTRAVENOUS | Status: DC
  Administered 2021-02-14: 22:00:00 3 mL via INTRAVENOUS

## 2021-02-14 MED ORDER — HEPARIN (PORCINE) IN NACL 1000-0.9 UT/500ML-% IV SOLN
INTRAVENOUS | Status: DC | PRN
  Administered 2021-02-14 (×2): 500 mL

## 2021-02-14 MED ORDER — IOHEXOL 350 MG/ML SOLN
INTRAVENOUS | Status: DC | PRN
  Administered 2021-02-14: 09:00:00 180 mL

## 2021-02-14 MED ORDER — NITROGLYCERIN 1 MG/10 ML FOR IR/CATH LAB
INTRA_ARTERIAL | Status: DC | PRN
  Administered 2021-02-14: 200 ug via INTRACORONARY

## 2021-02-14 MED ORDER — HEPARIN (PORCINE) IN NACL 1000-0.9 UT/500ML-% IV SOLN
INTRAVENOUS | Status: AC
  Filled 2021-02-14: qty 1000

## 2021-02-14 MED ORDER — FENTANYL CITRATE (PF) 100 MCG/2ML IJ SOLN
INTRAMUSCULAR | Status: DC | PRN
  Administered 2021-02-14: 25 ug via INTRAVENOUS

## 2021-02-14 MED ORDER — LOSARTAN POTASSIUM 25 MG PO TABS
25.0000 mg | ORAL_TABLET | Freq: Every day | ORAL | Status: DC
  Administered 2021-02-14 – 2021-02-15 (×2): 25 mg via ORAL
  Filled 2021-02-14 (×2): qty 1

## 2021-02-14 MED ORDER — MORPHINE SULFATE (PF) 4 MG/ML IV SOLN
4.0000 mg | Freq: Once | INTRAVENOUS | Status: AC
  Administered 2021-02-14: 03:00:00 4 mg via INTRAVENOUS
  Filled 2021-02-14: qty 1

## 2021-02-14 MED ORDER — TICAGRELOR 90 MG PO TABS
90.0000 mg | ORAL_TABLET | Freq: Two times a day (BID) | ORAL | Status: DC
  Administered 2021-02-14 – 2021-02-15 (×2): 90 mg via ORAL
  Filled 2021-02-14 (×2): qty 1

## 2021-02-14 MED ORDER — HEPARIN (PORCINE) 25000 UT/250ML-% IV SOLN
1000.0000 [IU]/h | INTRAVENOUS | Status: DC
Start: 2021-02-14 — End: 2021-02-14
  Administered 2021-02-14: 04:00:00 1000 [IU]/h via INTRAVENOUS
  Filled 2021-02-14: qty 250

## 2021-02-14 MED ORDER — LIDOCAINE HCL (PF) 1 % IJ SOLN
INTRAMUSCULAR | Status: DC | PRN
  Administered 2021-02-14: 3 mL

## 2021-02-14 MED ORDER — VERAPAMIL HCL 2.5 MG/ML IV SOLN
INTRAVENOUS | Status: DC | PRN
  Administered 2021-02-14: 09:00:00 10 mL via INTRA_ARTERIAL

## 2021-02-14 MED ORDER — SODIUM CHLORIDE 0.9% FLUSH: 3.0000 mL | INTRAVENOUS | Status: DC | PRN

## 2021-02-14 MED ORDER — ASPIRIN EC 81 MG PO TBEC
81.0000 mg | DELAYED_RELEASE_TABLET | Freq: Every day | ORAL | Status: DC
  Administered 2021-02-15: 10:00:00 81 mg via ORAL
  Filled 2021-02-14: qty 1

## 2021-02-14 MED ORDER — NITROGLYCERIN 1 MG/10 ML FOR IR/CATH LAB
INTRA_ARTERIAL | Status: AC
  Filled 2021-02-14: qty 10

## 2021-02-14 MED ORDER — ACETAMINOPHEN 325 MG PO TABS
650.0000 mg | ORAL_TABLET | ORAL | Status: DC | PRN
  Administered 2021-02-15 (×2): 650 mg via ORAL
  Filled 2021-02-14 (×2): qty 2

## 2021-02-14 SURGICAL SUPPLY — 20 items
BALL SAPPHIRE NC24 3.75X22 (BALLOONS) ×3
BALLN SAPPHIRE 2.5X12 (BALLOONS) ×3
BALLOON SAPPHIRE 2.5X12 (BALLOONS) IMPLANT
BALLOON SAPPHIRE NC24 3.75X22 (BALLOONS) IMPLANT
CATH INFINITI 5FR MULTPACK ANG (CATHETERS) ×2 IMPLANT
CATH LAUNCHER 5F EBU4.0 (CATHETERS) ×2 IMPLANT
CATH LAUNCHER 5F RADL (CATHETERS) IMPLANT
CATHETER LAUNCHER 5F RADL (CATHETERS) ×3
DEVICE RAD COMP TR BAND LRG (VASCULAR PRODUCTS) ×2 IMPLANT
GLIDESHEATH SLEND SS 6F .021 (SHEATH) ×2 IMPLANT
GUIDEWIRE INQWIRE 1.5J.035X260 (WIRE) IMPLANT
INQWIRE 1.5J .035X260CM (WIRE) ×3
KIT ENCORE 26 ADVANTAGE (KITS) ×2 IMPLANT
KIT ESSENTIALS PG (KITS) ×2 IMPLANT
KIT HEART LEFT (KITS) ×3 IMPLANT
PACK CARDIAC CATHETERIZATION (CUSTOM PROCEDURE TRAY) ×3 IMPLANT
STENT ONYX FRONTIER 3.5X26 (Permanent Stent) ×2 IMPLANT
TRANSDUCER W/STOPCOCK (MISCELLANEOUS) ×3 IMPLANT
TUBING CIL FLEX 10 FLL-RA (TUBING) ×3 IMPLANT
WIRE ASAHI PROWATER 180CM (WIRE) ×2 IMPLANT

## 2021-02-14 NOTE — Progress Notes (Signed)
Called for Tr Band oozing. Trband present on right radial artery. No hematoma present. 2cc air added to Tr band for a total of 15cc.    Sp02 95% as measured in right hand. Capillary refill present. Systolic BP [id number].

## 2021-02-14 NOTE — Progress Notes (Addendum)
Progress Note  Patient Name: Mike Schmidt Date of Encounter: 02/14/2021  [facility] Cardiologist: Dr. Quay Burow  Subjective   Patient minimal chest pain.  He is pain-free currently on IV heparin.  Inpatient Medications    Scheduled Meds:  [START ON 02/15/2021] aspirin EC  81 mg Oral Daily   carvedilol  6.25 mg Oral BID WC   loratadine  10 mg Oral Daily   losartan  25 mg Oral Daily   mometasone-formoterol  2 puff Inhalation BID   rosuvastatin  40 mg Oral Daily   valACYclovir  1,000 mg Oral Daily   Continuous Infusions:  sodium chloride     Followed by   sodium chloride     heparin 1,000 Units/hr (02/14/21 0402)   PRN Meds: acetaminophen, nitroGLYCERIN, ondansetron (ZOFRAN) IV   Vital Signs    Vitals:   02/14/21 0410 02/14/21 0430 02/14/21 0505 02/14/21 0700  BP: (!) 173/104 (!) 164/103 (!) 172/101 (!) 168/105  Pulse: 82 72 83 79  Resp: 20   20  Temp:      TempSrc:      SpO2: 96% 95%  93%  Weight:      Height:       No intake or output data in the 24 hours ending 02/14/21 0755 Last 3 Weights 02/13/2021 11/18/2018 11/04/2018  Weight (lbs) 185 lb 197 lb 197 lb  Weight (kg) 83.915 kg 89.359 kg 89.359 kg      Telemetry    Sinus rhythm- Personally Reviewed  ECG    Sinus rhythm at 71 with subtle inferior ST segment elevation.- Personally Reviewed  Physical Exam   GEN: No acute distress.   Neck: No JVD Cardiac: RRR, no murmurs, rubs, or gallops.  Respiratory: Clear to auscultation bilaterally. GI: Soft, nontender, non-distended  MS: No edema; No deformity. Neuro:  Nonfocal  Psych: Normal affect   Labs    High Sensitivity Troponin:   Recent Labs  Lab 02/13/21 2342 02/14/21 0201  TROPONINIHS 95* 779*     Chemistry Recent Labs  Lab 02/13/21 2342 02/14/21 0448  NA 138 135  K 3.7 4.1  CL 106 103  CO2 23 24  GLUCOSE 162* 134*  BUN 19 16  CREATININE 1.02 0.97  CALCIUM 8.6* 8.2*  PROT 6.4*  --   ALBUMIN 3.9  --   AST 39  --    ALT 69*  --   ALKPHOS 59  --   BILITOT 0.4  --   GFRNONAA >60 >60  ANIONGAP 9 8    Lipids  Recent Labs  Lab 02/14/21 0448  CHOL 158  TRIG 92  HDL 40*  LDLCALC 100*  CHOLHDL 4.0    Hematology Recent Labs  Lab 02/13/21 2342 02/14/21 0448  WBC 8.9 7.4  RBC 5.08 5.24  HGB 15.1 15.6  HCT 45.1 46.9  MCV 88.8 89.5  MCH 29.7 29.8  MCHC 33.5 33.3  RDW 13.3 13.6  PLT 248 250   Thyroid  Recent Labs  Lab 02/14/21 0448  TSH 1.915    BNP Recent Labs  Lab 02/14/21 0448  BNP 26.8    DDimer No results for input(s): DDIMER in the last 168 hours.   Radiology    DG Chest 2 View  Result Date: 02/13/2021 CLINICAL DATA:  Left-sided chest pain. EXAM: CHEST - 2 VIEW COMPARISON:  January 03, 2014 FINDINGS: Mildly decreased lung volumes are seen. Mild atelectasis is noted within the bilateral lung bases. There is no evidence of a  pleural effusion or pneumothorax. The heart size and mediastinal contours are within normal limits. The visualized skeletal structures are unremarkable. IMPRESSION: Mild bibasilar atelectasis. Electronically Signed   By: Virgina Norfolk M.D.   On: 02/13/2021 23:54   CT Angio Chest/Abd/Pel for Dissection W and/or Wo Contrast  Result Date: 02/14/2021 CLINICAL DATA:  Chest pain or back pain, aortic dissection suspected EXAM: CT ANGIOGRAPHY CHEST, ABDOMEN AND PELVIS TECHNIQUE: Non-contrast CT of the chest was initially obtained. Multidetector CT imaging through the chest, abdomen and pelvis was performed using the standard protocol during bolus administration of intravenous contrast. Multiplanar reconstructed images and MIPs were obtained and reviewed to evaluate the vascular anatomy. CONTRAST:  17mL OMNIPAQUE IOHEXOL 350 MG/ML SOLN COMPARISON:  None. FINDINGS: CTA CHEST FINDINGS Cardiovascular: No significant coronary artery calcification. Global cardiac size within normal limits. No pericardial effusion. Central pulmonary arteries are of normal caliber. There  is adequate opacification of the pulmonary arterial tree and no intraluminal filling defect is seen through the segmental level to suggest acute pulmonary embolism. The thoracic aorta is of normal caliber. No intramural hematoma or dissection. Aberrant right subclavian artery noted. The arch vasculature is widely patent proximally. No significant atherosclerotic calcification. Mediastinum/Nodes: No enlarged mediastinal, hilar, or axillary lymph nodes. Thyroid gland, trachea, and esophagus demonstrate no significant findings. Lungs/Pleura: Mild right basilar atelectasis. Lungs are otherwise clear. No pneumothorax or pleural effusion. Central airways are widely patent. 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 No acute bone abnormality. Review of the MIP images confirms the above findings. CTA ABDOMEN AND PELVIS FINDINGS VASCULAR Aorta: Normal caliber aorta without aneurysm, dissection, vasculitis or significant stenosis. Celiac: Patent without evidence of aneurysm, dissection, vasculitis or significant stenosis. SMA: Patent without evidence of aneurysm, dissection, vasculitis or significant stenosis. Renals: Both renal arteries are patent without evidence of aneurysm, dissection, vasculitis, fibromuscular dysplasia or significant stenosis. IMA: Patent without evidence of aneurysm, dissection, vasculitis or significant stenosis. Inflow: Patent without evidence of aneurysm, dissection, vasculitis or significant stenosis. Veins: No obvious venous abnormality within the limitations of this arterial phase study. Review of the MIP images confirms the above findings. NON-VASCULAR Hepatobiliary: No focal liver abnormality is seen. No gallstones, gallbladder wall thickening, or biliary dilatation. Pancreas: Unremarkable Spleen: Unremarkable Adrenals/Urinary Tract: Adrenal  glands are unremarkable. Kidneys are normal, without renal calculi, focal lesion, or hydronephrosis. Bladder is unremarkable. Stomach/Bowel: Mild descending and sigmoid colonic diverticulosis without superimposed acute inflammatory change. The stomach, small bowel, and large bowel are otherwise unremarkable. Appendix normal. No free intraperitoneal gas. Lymphatic: No pathologic adenopathy within the abdomen and pelvis. Reproductive: Prostate is unremarkable. Other: No abdominal wall hernia or abnormality. No abdominopelvic ascites. Musculoskeletal: No acute bone abnormality. Degenerative changes are seen at the lumbosacral junction. Review of the MIP images confirms the above findings. IMPRESSION: No evidence of thoracoabdominal aortic aneurysm or dissection. No acute intrathoracic or intra-abdominal pathology identified. No definite radiographic explanation for the patient's reported symptoms. Incidental findings as noted above. Electronically Signed   By: Fidela Salisbury M.D.   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  He has no cardiac risk factors.  He developed chest pain yesterday evening and was transported by EMS to [facility] emergency room where he was evaluated and seen by cardiology.  His enzymes initially were 95  which rose to 779.  He currently is pain-free on IV heparin awaiting cardiac catheterization.  Assessment & Plan    1: Non-STEMI-troponins elevated at 779.  EKG shows no acute changes.  Patient is currently pain-free on IV heparin.  Plan coronary angiography this morning.The patient understands that risks included but are not limited to stroke (1 in 1000), death (1 in 76), kidney failure [usually temporary] (1 in 500), bleeding (1 in 200), allergic reaction [possibly serious] (1 in 200).  The patient understands and agrees to proceed  2: Hyperlipidemia-LDL 100.  Currently not on statin therapy.  We will  begin high-dose statin therapy after his coronary angiogram  3: Essential hypertension-on no antihypertensive medications.  He will ultimately be on a beta-blocker plus or minus an ACE//ARB.  For questions or updates, please contact [place] Please consult [email or web address] for contact info under        Signed, Quay Burow, MD  02/14/2021, 7:55 AM     Addendum: Mr. Felten underwent cardiac catheterization by Dr. 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  He had PCI drug-eluting stenting.  His EF appeared to be well preserved.  I anticipate discharge tomorrow.  Lorretta Harp, M.D., [place], [facility], [place] [address] [place], [place]  [address]  [phone] 02/14/2021 12:09 PM

## 2021-02-14 NOTE — H&P (Signed)
Cardiology Admission History and Physical:   Patient ID: Mike Schmidt MRN: [id number]; DOB: [birth year]   Admission date: 02/13/2021  PCP:  Merrilee Seashore, MD   [facility] Providers Cardiologist:  None        Chief Complaint:  chest pain  Patient Profile:   Mike Schmidt is a 55 y.o. male with hypertension and prediabetes who is being seen 02/14/2021 for the evaluation of chest pain and elevated troponin.  History of Present Illness:   Mike Schmidt is a 55 year old male with past medical history as above who presented with chest pain.  He was in his usual state of health until yesterday evening when he developed chest pain that was substernal and radiated to his arm.  It was worsened by exertion.  He called EMS and was brought here where he's received aspirin, nitroglycerin, and heparin.  He is pain free at this point.  He has not noticed any exertional dyspnea, orthopnea, PND, lower extremity edema, or palpitations.  Symptoms of chest pain in 2010 provoked a workup at that time which was negative (sounds like just stress test) and he has done pretty well since.  He has a remote (15 y ago) smoking history but has abstained since and really doesn't have any bad habits otherwise.  He is accompanied by his attentive wife.  They have actually been working on their lifestyle and dietary modification for weight loss and with interest in improving their overall health during the weeks prior.  Past Medical History:  Diagnosis Date   Allergic rhinitis, cause unspecified    Pollen   Allergy    Anxiety    Asthma    Internal hemorrhoids without mention of complication    Prediabetes 01/2011    Past Surgical History:  Procedure Laterality Date   COLONOSCOPY  11/12/2017   MUSCLE BIOPSY     POLYPECTOMY       Medications Prior to Admission: Prior to Admission medications   Medication Sig Start Date End Date Taking? Authorizing Provider  cetirizine (ZYRTEC) 10 MG tablet Take 10  mg by mouth at bedtime.     Yes [provider]  CINNAMON PO Take 1 capsule by mouth daily.   Yes [provider]  famciclovir (FAMVIR) 250 MG tablet Take 250 mg by mouth 2 (two) times daily. 10/25/15  Yes [provider]  Fluticasone-Salmeterol (ADVAIR) 250-50 MCG/DOSE AEPB Inhale 1 puff into the lungs every 12 (twelve) hours.     Yes [provider]  GARLIC PO Take 1 capsule by mouth daily.   Yes [provider]  ibuprofen (ADVIL) 200 MG tablet Take 400 mg by mouth every 6 (six) hours as needed for headache or moderate pain.   Yes [provider]  Multiple Vitamins-Minerals (MULTIVITAL) tablet Take 1 tablet by mouth daily.     Yes [provider]  OVER THE COUNTER MEDICATION Apply 1 application topically at bedtime as needed (ankle pain). Hempvana max pain relief   Yes [provider]  Polyethyl Glycol-Propyl Glycol (SYSTANE) 0.4-0.3 % GEL ophthalmic gel Place 1 application into both eyes at bedtime.   Yes [provider]  Zinc 50 MG TABS Take 50 mg by mouth daily.   Yes [provider]     Allergies:   No Known Allergies  Social History:   Social History   Socioeconomic History   Marital status: Married    Spouse name: Not on file   Number of children: Not on  file   Years of education: Not on file   Highest education level: Not on file  Occupational History   Not on file  Tobacco Use   Smoking status: Former   Smokeless tobacco: Never  Vaping Use   Vaping Use: Never used  Substance and Sexual Activity   Alcohol use: No   Drug use: Not Currently   Sexual activity: Not on file  Other Topics Concern   Not on file  Social History Narrative   Not on file   Social Determinants of Health   Financial Resource Strain: Not on file  Food Insecurity: Not on file  Transportation Needs: Not on file  Physical Activity: Not on file  Stress: Not on file  Social Connections: Not on file  Intimate  Partner Violence: Not on file    Family History:   His father and brother have cardiovascular disease; father has pretty extensive PAD as a lifelong smoker. The patient's family history includes Colon polyps in his sister; Depression in his father; Diabetes in his brother and mother; Hypertension in his brother, father, and sister. There is no history of Colon cancer, Esophageal cancer, Rectal cancer, or Stomach cancer.    ROS:  Please see the history of present illness.  All other ROS reviewed and negative.     Physical Exam/Data:   Vitals:   02/14/21 0247 02/14/21 0300 02/14/21 0400 02/14/21 0410  BP: (!) 168/106 (!) 174/93 (!) 173/104 (!) 173/104  Pulse: 76 77 81 82  Resp:    20  Temp:      TempSrc:      SpO2: 95% 96% 95% 96%  Weight:      Height:       No intake or output data in the 24 hours ending 02/14/21 0428 Last 3 Weights 02/13/2021 11/18/2018 11/04/2018  Weight (lbs) 185 lb 197 lb 197 lb  Weight (kg) 83.915 kg 89.359 kg 89.359 kg     Body mass index is 28.13 kg/m.  General:  Well nourished, well developed, in no acute distress HEENT: normal Neck: no JVD Vascular: No carotid bruits; robust radial, DP, and PT pulses bilaterally Cardiac:  normal S1, S2; RRR; no murmur Lungs:  clear to auscultation bilaterally, no wheezing, rhonchi or rales  Abd: soft, nontender, no hepatomegaly  Ext: no edema Musculoskeletal:  No deformities, BUE and BLE strength normal and equal Skin: warm and dry  Neuro:  CNs 2-12 intact, no focal abnormalities noted Psych:  Normal affect    EKG:  The ECG that was done today was personally reviewed and demonstrates normal sinus rhythm some borderline STE in inferior leads but looks consistent with prior early repolarization  Relevant CV Studies: None  Laboratory Data:  High Sensitivity Troponin:   Recent Labs  Lab 02/13/21 2342 02/14/21 0201  TROPONINIHS 95* 779*      Chemistry Recent Labs  Lab 02/13/21 2342  NA 138  K 3.7  CL  106  CO2 23  GLUCOSE 162*  BUN 19  CREATININE 1.02  CALCIUM 8.6*  GFRNONAA >60  ANIONGAP 9    Recent Labs  Lab 02/13/21 2342  PROT 6.4*  ALBUMIN 3.9  AST 39  ALT 69*  ALKPHOS 59  BILITOT 0.4   Lipids No results for input(s): CHOL, TRIG, HDL, LABVLDL, LDLCALC, CHOLHDL in the last 168 hours. Hematology Recent Labs  Lab 02/13/21 2342  WBC 8.9  RBC 5.08  HGB 15.1  HCT 45.1  MCV 88.8  MCH 29.7  MCHC 33.5  RDW 13.3  PLT 248   Thyroid No results for input(s): TSH, FREET4 in the last 168 hours. BNPNo results for input(s): BNP, PROBNP in the last 168 hours.  DDimer No results for input(s): DDIMER in the last 168 hours.   Radiology/Studies:  DG Chest 2 View  Result Date: 02/13/2021 CLINICAL DATA:  Left-sided chest pain. EXAM: CHEST - 2 VIEW COMPARISON:  January 03, 2014 FINDINGS: Mildly decreased lung volumes are seen. Mild atelectasis is noted within the bilateral lung bases. There is no evidence of a pleural effusion or pneumothorax. The heart size and mediastinal contours are within normal limits. The visualized skeletal structures are unremarkable. IMPRESSION: Mild bibasilar atelectasis. Electronically Signed   By: Virgina Norfolk M.D.   On: 02/13/2021 23:54   CT Angio Chest/Abd/Pel for Dissection W and/or Wo Contrast  Result Date: 02/14/2021 CLINICAL DATA:  Chest pain or back pain, aortic dissection suspected EXAM: CT ANGIOGRAPHY CHEST, ABDOMEN AND PELVIS TECHNIQUE: Non-contrast CT of the chest was initially obtained. Multidetector CT imaging through the chest, abdomen and pelvis was performed using the standard protocol during bolus administration of intravenous contrast. Multiplanar reconstructed images and MIPs were obtained and reviewed to evaluate the vascular anatomy. CONTRAST:  137mL OMNIPAQUE IOHEXOL 350 MG/ML SOLN COMPARISON:  None. FINDINGS: CTA CHEST FINDINGS Cardiovascular: No significant coronary artery calcification. Global cardiac size within normal  limits. No pericardial effusion. Central pulmonary arteries are of normal caliber. There is adequate opacification of the pulmonary arterial tree and no intraluminal filling defect is seen through the segmental level to suggest acute pulmonary embolism. The thoracic aorta is of normal caliber. No intramural hematoma or dissection. Aberrant right subclavian artery noted. The arch vasculature is widely patent proximally. No significant atherosclerotic calcification. Mediastinum/Nodes: No enlarged mediastinal, hilar, or axillary lymph nodes. Thyroid gland, trachea, and esophagus demonstrate no significant findings. Lungs/Pleura: Mild right basilar atelectasis. Lungs are otherwise clear. No pneumothorax or pleural effusion. Central airways are widely patent. 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 No acute bone abnormality. Review of the MIP images confirms the above findings. CTA ABDOMEN AND PELVIS FINDINGS VASCULAR Aorta: Normal caliber aorta without aneurysm, dissection, vasculitis or significant stenosis. Celiac: Patent without evidence of aneurysm, dissection, vasculitis or significant stenosis. SMA: Patent without evidence of aneurysm, dissection, vasculitis or significant stenosis. Renals: Both renal arteries are patent without evidence of aneurysm, dissection, vasculitis, fibromuscular dysplasia or significant stenosis. IMA: Patent without evidence of aneurysm, dissection, vasculitis or significant stenosis. Inflow: Patent without evidence of aneurysm, dissection, vasculitis or significant stenosis. Veins: No obvious venous abnormality within the limitations of this arterial phase study. Review of the MIP images confirms the above findings. NON-VASCULAR Hepatobiliary: No focal liver abnormality is seen. No gallstones, gallbladder wall thickening, or biliary  dilatation. Pancreas: Unremarkable Spleen: Unremarkable Adrenals/Urinary Tract: Adrenal glands are unremarkable. Kidneys are normal, without renal calculi, focal lesion, or hydronephrosis. Bladder is unremarkable. Stomach/Bowel: Mild descending and sigmoid colonic diverticulosis without superimposed acute inflammatory change. The stomach, small bowel, and large bowel are otherwise unremarkable. Appendix normal. No free intraperitoneal gas. Lymphatic: No pathologic adenopathy within the abdomen and pelvis. Reproductive: Prostate is unremarkable. Other: No abdominal wall hernia or abnormality. No abdominopelvic ascites. Musculoskeletal: No acute bone abnormality. Degenerative changes are seen at the lumbosacral junction. Review of the MIP images confirms the above findings. IMPRESSION: No evidence of thoracoabdominal aortic aneurysm or dissection. No acute intrathoracic or  intra-abdominal pathology identified. No definite radiographic explanation for the patient's reported symptoms. Incidental findings as noted above. Electronically Signed   By: Fidela Salisbury M.D.   On: 02/14/2021 02:37     Assessment and Plan:   This is a 55 year old male with a history of hypertension and prediabetes who presents with chest pain and elevated troponin.  Troponin trending up consistent with acute coronary syndrome.  He has no heart failure at presentation and chemistries are within normal limits.  Plan for early invasive approach.  Problem list Chest pain Elevated troponin Hypertension Prediabetes  #Chest pain #Elevated troponin - ASA 81 mg daily - Heparin infusion - Rosuvastatin 40 mg now and indefinitely - TTE - NPO for cath - Risk startifaication labs - Cardiac rehab referral - Telemetry  #Hypertension - Start carvedilol 6.25 mg BID - Start losartan 25 mg daily  #Prediabetes - A1C; if >6.5 plan for SGLT2i  Risk Assessment/Risk Scores:    TIMI Risk Score for Unstable Angina or Non-ST Elevation MI:    The patient's TIMI risk score is 3, which indicates a 13% risk of all cause mortality, new or recurrent myocardial infarction or need for urgent revascularization in the next 14 days.       Severity of Illness: The appropriate patient status for this patient is INPATIENT. Inpatient status is judged to be reasonable and necessary in order to provide the required intensity of service to ensure the patient's safety. The patient's presenting symptoms, physical exam findings, and initial radiographic and laboratory data in the context of their chronic comorbidities is felt to place them at high risk for further clinical deterioration. Furthermore, it is not anticipated that the patient will be medically stable for discharge from the hospital within 2 midnights of admission.   * I certify that at the point of admission it is my clinical judgment that the patient will require inpatient hospital care spanning beyond 2 midnights from the point of admission due to high intensity of service, high risk for further deterioration and high frequency of surveillance required.*   For questions or updates, please contact [place] Please consult [email or web address] for contact info under     Signed, Delight Hoh, MD  02/14/2021 4:28 AM

## 2021-02-14 NOTE — Progress Notes (Signed)
Discussed with pt and wife MI, stent, restrictions, diet, exercise, NTG, and CRPII. Pt receptive. Understands importance of Brilinta. Will refer to [place], ACSM 2:49 PM 02/14/2021

## 2021-02-14 NOTE — Interval H&P Note (Signed)
History and Physical Interval Note:  02/14/2021 8:25 AM  Mike Schmidt  has presented today for surgery, with the diagnosis of nstemi.  The various methods of treatment have been discussed with the patient and family. After consideration of risks, benefits and other options for treatment, the patient has consented to  Procedure(s): LEFT HEART CATH AND CORONARY ANGIOGRAPHY (N/A) as a surgical intervention.  The patient's history has been reviewed, patient examined, no change in status, stable for surgery.  I have reviewed the patient's chart and labs.  Questions were answered to the patient's satisfaction.   Cath Lab Visit (complete for each Cath Lab visit)  Clinical Evaluation Leading to the Procedure:   ACS: Yes.    Non-ACS:    Anginal Classification: CCS IV  Anti-ischemic medical therapy: No Therapy  Non-Invasive Test Results: No non-invasive testing performed  Prior CABG: No previous CABG        [name] [facility] 02/14/2021 8:25 AM

## 2021-02-14 NOTE — H&P (View-Only) (Signed)
Progress Note  Patient Name: Mike Schmidt Date of Encounter: 02/14/2021  [facility] Cardiologist: Dr. Quay Burow  Subjective   Patient minimal chest pain.  He is pain-free currently on IV heparin.  Inpatient Medications    Scheduled Meds:  [START ON 02/15/2021] aspirin EC  81 mg Oral Daily   carvedilol  6.25 mg Oral BID WC   loratadine  10 mg Oral Daily   losartan  25 mg Oral Daily   mometasone-formoterol  2 puff Inhalation BID   rosuvastatin  40 mg Oral Daily   valACYclovir  1,000 mg Oral Daily   Continuous Infusions:  sodium chloride     Followed by   sodium chloride     heparin 1,000 Units/hr (02/14/21 0402)   PRN Meds: acetaminophen, nitroGLYCERIN, ondansetron (ZOFRAN) IV   Vital Signs    Vitals:   02/14/21 0410 02/14/21 0430 02/14/21 0505 02/14/21 0700  BP: (!) 173/104 (!) 164/103 (!) 172/101 (!) 168/105  Pulse: 82 72 83 79  Resp: 20   20  Temp:      TempSrc:      SpO2: 96% 95%  93%  Weight:      Height:       No intake or output data in the 24 hours ending 02/14/21 0755 Last 3 Weights 02/13/2021 11/18/2018 11/04/2018  Weight (lbs) 185 lb 197 lb 197 lb  Weight (kg) 83.915 kg 89.359 kg 89.359 kg      Telemetry    Sinus rhythm- Personally Reviewed  ECG    Sinus rhythm at 71 with subtle inferior ST segment elevation.- Personally Reviewed  Physical Exam   GEN: No acute distress.   Neck: No JVD Cardiac: RRR, no murmurs, rubs, or gallops.  Respiratory: Clear to auscultation bilaterally. GI: Soft, nontender, non-distended  MS: No edema; No deformity. Neuro:  Nonfocal  Psych: Normal affect   Labs    High Sensitivity Troponin:   Recent Labs  Lab 02/13/21 2342 02/14/21 0201  TROPONINIHS 95* 779*     Chemistry Recent Labs  Lab 02/13/21 2342 02/14/21 0448  NA 138 135  K 3.7 4.1  CL 106 103  CO2 23 24  GLUCOSE 162* 134*  BUN 19 16  CREATININE 1.02 0.97  CALCIUM 8.6* 8.2*  PROT 6.4*  --   ALBUMIN 3.9  --   AST 39  --    ALT 69*  --   ALKPHOS 59  --   BILITOT 0.4  --   GFRNONAA >60 >60  ANIONGAP 9 8    Lipids  Recent Labs  Lab 02/14/21 0448  CHOL 158  TRIG 92  HDL 40*  LDLCALC 100*  CHOLHDL 4.0    Hematology Recent Labs  Lab 02/13/21 2342 02/14/21 0448  WBC 8.9 7.4  RBC 5.08 5.24  HGB 15.1 15.6  HCT 45.1 46.9  MCV 88.8 89.5  MCH 29.7 29.8  MCHC 33.5 33.3  RDW 13.3 13.6  PLT 248 250   Thyroid  Recent Labs  Lab 02/14/21 0448  TSH 1.915    BNP Recent Labs  Lab 02/14/21 0448  BNP 26.8    DDimer No results for input(s): DDIMER in the last 168 hours.   Radiology    DG Chest 2 View  Result Date: 02/13/2021 CLINICAL DATA:  Left-sided chest pain. EXAM: CHEST - 2 VIEW COMPARISON:  January 03, 2014 FINDINGS: Mildly decreased lung volumes are seen. Mild atelectasis is noted within the bilateral lung bases. There is no evidence of a  pleural effusion or pneumothorax. The heart size and mediastinal contours are within normal limits. The visualized skeletal structures are unremarkable. IMPRESSION: Mild bibasilar atelectasis. Electronically Signed   By: Virgina Norfolk M.D.   On: 02/13/2021 23:54   CT Angio Chest/Abd/Pel for Dissection W and/or Wo Contrast  Result Date: 02/14/2021 CLINICAL DATA:  Chest pain or back pain, aortic dissection suspected EXAM: CT ANGIOGRAPHY CHEST, ABDOMEN AND PELVIS TECHNIQUE: Non-contrast CT of the chest was initially obtained. Multidetector CT imaging through the chest, abdomen and pelvis was performed using the standard protocol during bolus administration of intravenous contrast. Multiplanar reconstructed images and MIPs were obtained and reviewed to evaluate the vascular anatomy. CONTRAST:  140mL OMNIPAQUE IOHEXOL 350 MG/ML SOLN COMPARISON:  None. FINDINGS: CTA CHEST FINDINGS Cardiovascular: No significant coronary artery calcification. Global cardiac size within normal limits. No pericardial effusion. Central pulmonary arteries are of normal caliber. There  is adequate opacification of the pulmonary arterial tree and no intraluminal filling defect is seen through the segmental level to suggest acute pulmonary embolism. The thoracic aorta is of normal caliber. No intramural hematoma or dissection. Aberrant right subclavian artery noted. The arch vasculature is widely patent proximally. No significant atherosclerotic calcification. Mediastinum/Nodes: No enlarged mediastinal, hilar, or axillary lymph nodes. Thyroid gland, trachea, and esophagus demonstrate no significant findings. Lungs/Pleura: Mild right basilar atelectasis. Lungs are otherwise clear. No pneumothorax or pleural effusion. Central airways are widely patent. 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 No acute bone abnormality. Review of the MIP images confirms the above findings. CTA ABDOMEN AND PELVIS FINDINGS VASCULAR Aorta: Normal caliber aorta without aneurysm, dissection, vasculitis or significant stenosis. Celiac: Patent without evidence of aneurysm, dissection, vasculitis or significant stenosis. SMA: Patent without evidence of aneurysm, dissection, vasculitis or significant stenosis. Renals: Both renal arteries are patent without evidence of aneurysm, dissection, vasculitis, fibromuscular dysplasia or significant stenosis. IMA: Patent without evidence of aneurysm, dissection, vasculitis or significant stenosis. Inflow: Patent without evidence of aneurysm, dissection, vasculitis or significant stenosis. Veins: No obvious venous abnormality within the limitations of this arterial phase study. Review of the MIP images confirms the above findings. NON-VASCULAR Hepatobiliary: No focal liver abnormality is seen. No gallstones, gallbladder wall thickening, or biliary dilatation. Pancreas: Unremarkable Spleen: Unremarkable Adrenals/Urinary Tract: Adrenal  glands are unremarkable. Kidneys are normal, without renal calculi, focal lesion, or hydronephrosis. Bladder is unremarkable. Stomach/Bowel: Mild descending and sigmoid colonic diverticulosis without superimposed acute inflammatory change. The stomach, small bowel, and large bowel are otherwise unremarkable. Appendix normal. No free intraperitoneal gas. Lymphatic: No pathologic adenopathy within the abdomen and pelvis. Reproductive: Prostate is unremarkable. Other: No abdominal wall hernia or abnormality. No abdominopelvic ascites. Musculoskeletal: No acute bone abnormality. Degenerative changes are seen at the lumbosacral junction. Review of the MIP images confirms the above findings. IMPRESSION: No evidence of thoracoabdominal aortic aneurysm or dissection. No acute intrathoracic or intra-abdominal pathology identified. No definite radiographic explanation for the patient's reported symptoms. Incidental findings as noted above. Electronically Signed   By: Fidela Salisbury M.D.   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  He has no cardiac risk factors.  He developed chest pain yesterday evening and was transported by EMS to [facility] emergency room where he was evaluated and seen by cardiology.  His enzymes initially were 95  which rose to 779.  He currently is pain-free on IV heparin awaiting cardiac catheterization.  Assessment & Plan    1: Non-STEMI-troponins elevated at 779.  EKG shows no acute changes.  Patient is currently pain-free on IV heparin.  Plan coronary angiography this morning.The patient understands that risks included but are not limited to stroke (1 in 1000), death (1 in 23), kidney failure [usually temporary] (1 in 500), bleeding (1 in 200), allergic reaction [possibly serious] (1 in 200).  The patient understands and agrees to proceed  2: Hyperlipidemia-LDL 100.  Currently not on statin therapy.  We will  begin high-dose statin therapy after his coronary angiogram  3: Essential hypertension-on no antihypertensive medications.  He will ultimately be on a beta-blocker plus or minus an ACE//ARB.  For questions or updates, please contact [place] Please consult [email or web address] for contact info under        Signed, Quay Burow, MD  02/14/2021, 7:55 AM

## 2021-02-14 NOTE — ED Notes (Signed)
Dr Roxanne Mins notified of repeat troponin

## 2021-02-14 NOTE — Progress Notes (Signed)
ANTICOAGULATION CONSULT NOTE - Initial Consult  Pharmacy Consult for Heparin Indication: chest pain/ACS  No Known Allergies  Patient Measurements: Height: 5\' 8"  (172.7 cm) Weight: 83.9 kg (185 lb) IBW/kg (Calculated) : 68.4 Heparin Dosing Weight: 84 kg  Vital Signs: Temp: 98.4 F (36.9 C) (12/22 2301) Temp Source: Oral (12/22 2301) BP: 174/93 (12/23 0300) Pulse Rate: 77 (12/23 0300)  Labs: Recent Labs    02/13/21 2342 02/14/21 0201  HGB 15.1  --   HCT 45.1  --   PLT 248  --   CREATININE 1.02  --   TROPONINIHS 95* 779*    Estimated Creatinine Clearance: 88.4 mL/min (by C-G formula based on SCr of 1.02 mg/dL).   Medical History: Past Medical History:  Diagnosis Date   Allergic rhinitis, cause unspecified    Pollen   Allergy    Anxiety    Asthma    Internal hemorrhoids without mention of complication    Prediabetes 01/2011    Medications:  Await electronic med rec  Assessment: 55 y.o. M presents with CP. Trop up to 779. To start heparin for ACS. No AC PTA. CBC ok on admission  Goal of Therapy:  Heparin level 0.3-0.7 units/ml Monitor platelets by anticoagulation protocol: Yes   Plan:  Heparin IV bolus 4000 units Heparin gtt at 1000 units/hr Will f/u heparin level in 6 hours Daily heparin level and CBC  Sherlon Handing, PharmD, BCPS Please see amion for complete clinical pharmacist phone list 02/14/2021,3:12 AM

## 2021-02-14 NOTE — ED Notes (Signed)
ED Provider at bedside. 

## 2021-02-14 NOTE — ED Provider Notes (Addendum)
[facility] EMERGENCY DEPARTMENT Provider Note   CSN: [id number] Arrival date & time: 02/13/21  2302     History Chief Complaint  Patient presents with   Chest Pain    Mike Schmidt is a 55 y.o. male.  The history is provided by the patient.  Chest Pain He has history of asthma and remote history of opiate addiction and comes in because of chest pain.  Pain woke him up at about 10 PM.  He describes a severe heavy, tight feeling with radiation to left shoulder.  There is associated dyspnea, nausea, vomiting, diaphoresis.  Pain was rated at 10/10.  It has subsided and he currently rates pain at 3/10.  He has never had anything like this before.  He denies history of hypertension, diabetes, hyperlipidemia.  He is a non-smoker.  He denies family history of premature coronary atherosclerosis.   Past Medical History:  Diagnosis Date   Allergic rhinitis, cause unspecified    Pollen   Allergy    Anxiety    Asthma    Internal hemorrhoids without mention of complication    Prediabetes 01/2011    Patient Active Problem List   Diagnosis Date Noted   Palpitations 02/19/2014    Past Surgical History:  Procedure Laterality Date   COLONOSCOPY  11/12/2017   MUSCLE BIOPSY     POLYPECTOMY         Family History  Problem Relation Age of Onset   Diabetes Mother        T2DM   Hypertension Father    Depression Father    Hypertension Sister    Colon polyps Sister    Hypertension Brother    Diabetes Brother        T2DM   Colon cancer Neg Hx    Esophageal cancer Neg Hx    Rectal cancer Neg Hx    Stomach cancer Neg Hx     Social History   Tobacco Use   Smoking status: Former   Smokeless tobacco: Never  Scientific laboratory technician Use: Never used  Substance Use Topics   Alcohol use: No   Drug use: Not Currently    Home Medications Prior to Admission medications   Medication Sig Start Date End Date Taking? Authorizing Provider  ALBUTEROL IN Inhale into  the lungs as needed.    [provider]  b complex vitamins tablet Take 1 tablet by mouth daily.    [provider]  cetirizine (ZYRTEC) 10 MG tablet Take 10 mg by mouth at bedtime.      [provider]  CINNAMON PO Take by mouth.    [provider]  ECHINACEA PO Take 760 mg by mouth daily.    [provider]  famciclovir (FAMVIR) 250 MG tablet Take 250 mg by mouth 2 (two) times daily. 10/25/15   [provider]  fluticasone (FLONASE) 50 MCG/ACT nasal spray Place 2 sprays into the nose 2 (two) times daily.      [provider]  Fluticasone-Salmeterol (ADVAIR DISKUS) 250-50 MCG/DOSE AEPB Inhale 1 puff into the lungs every 12 (twelve) hours.      [provider]  GARLIC PO Take by mouth daily.    [provider]  LORAZEPAM PO Take by mouth.    [provider]  Multiple Vitamins-Minerals (MULTIVITAL) tablet Take 1 tablet by mouth daily.      [provider]  Polyethyl Glycol-Propyl Glycol (SYSTANE) 0.4-0.3 % GEL ophthalmic gel Place 1  application into both eyes.    [provider]  sertraline (ZOLOFT) 100 MG tablet Take 100 mg by mouth at bedtime.    [provider]  UNABLE TO FIND Med Name: Nitric Oxide  One tablet daily    [provider]  Zinc Picolinate 25 MG TABS Take by mouth.    [provider]    Allergies    Patient has no known allergies.  Review of Systems   Review of Systems  Cardiovascular:  Positive for chest pain.  All other systems reviewed and are negative.  Physical Exam Updated Vital Signs BP (!) 168/106 (BP Location: Right Arm)    Pulse 76    Temp 98.4 F (36.9 C) (Oral)    Resp (!) 22    Ht 5\' 8"  (1.727 m)    Wt 83.9 kg    SpO2 95%    BMI 28.13 kg/m   Physical Exam Vitals and nursing note reviewed.  55 year old male, resting comfortably and in no acute distress. Vital signs are significant for elevated blood pressure and slightly  elevated respiratory rate. Oxygen saturation is 96%, which is normal. Head is normocephalic and atraumatic. PERRLA, EOMI. Oropharynx is clear. Neck is nontender and supple without adenopathy or JVD. Back is nontender and there is no CVA tenderness. Lungs are clear without rales, wheezes, or rhonchi. Chest is nontender. Heart has regular rate and rhythm without murmur. Abdomen is soft, flat, nontender. Extremities have no cyanosis or edema, full range of motion is present. Skin is warm and dry without rash. Neurologic: Mental status is normal, cranial nerves are intact, moves all extremities equally.  ED Results / Procedures / Treatments   Labs (all labs ordered are listed, but only abnormal results are displayed) Labs Reviewed  COMPREHENSIVE METABOLIC PANEL - Abnormal; Notable for the following components:      Result Value   Glucose, Bld 162 (*)    Calcium 8.6 (*)    Total Protein 6.4 (*)    ALT 69 (*)    All other components within normal limits  LIPASE, BLOOD - Abnormal; Notable for the following components:   Lipase 189 (*)    All other components within normal limits  TROPONIN I (HIGH SENSITIVITY) - Abnormal; Notable for the following components:   Troponin I (High Sensitivity) 95 (*)    All other components within normal limits  TROPONIN I (HIGH SENSITIVITY) - Abnormal; Notable for the following components:   Troponin I (High Sensitivity) 779 (*)    All other components within normal limits  CBC  CBG MONITORING, ED    EKG None  Radiology DG Chest 2 View  Result Date: 02/13/2021 CLINICAL DATA:  Left-sided chest pain. EXAM: CHEST - 2 VIEW COMPARISON:  January 03, 2014 FINDINGS: Mildly decreased lung volumes are seen. Mild atelectasis is noted within the bilateral lung bases. There is no evidence of a pleural effusion or pneumothorax. The heart size and mediastinal contours are within normal limits. The visualized skeletal structures are unremarkable. IMPRESSION: Mild  bibasilar atelectasis. Electronically Signed   By: Virgina Norfolk M.D.   On: 02/13/2021 23:54   CT Angio Chest/Abd/Pel for Dissection W and/or Wo Contrast  Result Date: 02/14/2021 CLINICAL DATA:  Chest pain or back pain, aortic dissection suspected EXAM: CT ANGIOGRAPHY CHEST, ABDOMEN AND PELVIS TECHNIQUE: Non-contrast CT of the chest was initially obtained. Multidetector CT imaging through the chest, abdomen and pelvis was performed using the standard protocol during bolus administration of intravenous contrast.  Multiplanar reconstructed images and MIPs were obtained and reviewed to evaluate the vascular anatomy. CONTRAST:  131mL OMNIPAQUE IOHEXOL 350 MG/ML SOLN COMPARISON:  None. FINDINGS: CTA CHEST FINDINGS Cardiovascular: No significant coronary artery calcification. Global cardiac size within normal limits. No pericardial effusion. Central pulmonary arteries are of normal caliber. There is adequate opacification of the pulmonary arterial tree and no intraluminal filling defect is seen through the segmental level to suggest acute pulmonary embolism. The thoracic aorta is of normal caliber. No intramural hematoma or dissection. Aberrant right subclavian artery noted. The arch vasculature is widely patent proximally. No significant atherosclerotic calcification. Mediastinum/Nodes: No enlarged mediastinal, hilar, or axillary lymph nodes. Thyroid gland, trachea, and esophagus demonstrate no significant findings. Lungs/Pleura: Mild right basilar atelectasis. Lungs are otherwise clear. No pneumothorax or pleural effusion. Central airways are widely patent. 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 No acute bone abnormality. Review of the MIP images confirms the above findings. CTA ABDOMEN AND PELVIS FINDINGS VASCULAR Aorta: Normal caliber aorta without  aneurysm, dissection, vasculitis or significant stenosis. Celiac: Patent without evidence of aneurysm, dissection, vasculitis or significant stenosis. SMA: Patent without evidence of aneurysm, dissection, vasculitis or significant stenosis. Renals: Both renal arteries are patent without evidence of aneurysm, dissection, vasculitis, fibromuscular dysplasia or significant stenosis. IMA: Patent without evidence of aneurysm, dissection, vasculitis or significant stenosis. Inflow: Patent without evidence of aneurysm, dissection, vasculitis or significant stenosis. Veins: No obvious venous abnormality within the limitations of this arterial phase study. Review of the MIP images confirms the above findings. NON-VASCULAR Hepatobiliary: No focal liver abnormality is seen. No gallstones, gallbladder wall thickening, or biliary dilatation. Pancreas: Unremarkable Spleen: Unremarkable Adrenals/Urinary Tract: Adrenal glands are unremarkable. Kidneys are normal, without renal calculi, focal lesion, or hydronephrosis. Bladder is unremarkable. Stomach/Bowel: Mild descending and sigmoid colonic diverticulosis without superimposed acute inflammatory change. The stomach, small bowel, and large bowel are otherwise unremarkable. Appendix normal. No free intraperitoneal gas. Lymphatic: No pathologic adenopathy within the abdomen and pelvis. Reproductive: Prostate is unremarkable. Other: No abdominal wall hernia or abnormality. No abdominopelvic ascites. Musculoskeletal: No acute bone abnormality. Degenerative changes are seen at the lumbosacral junction. Review of the MIP images confirms the above findings. IMPRESSION: No evidence of thoracoabdominal aortic aneurysm or dissection. No acute intrathoracic or intra-abdominal pathology identified. No definite radiographic explanation for the patient's reported symptoms. Incidental findings as noted above. Electronically Signed   By: Fidela Salisbury M.D.   On: 02/14/2021 02:37     Procedures Procedures  CRITICAL CARE Performed by: Delora Fuel Total critical care time: 45 minutes Critical care time was exclusive of separately billable procedures and treating other patients. Critical care was necessary to treat or prevent imminent or life-threatening deterioration. Critical care was time spent personally by me on the following activities: development of treatment plan with patient and/or surrogate as well as nursing, discussions with consultants, evaluation of patient's response to treatment, examination of patient, obtaining history from patient or surrogate, ordering and performing treatments and interventions, ordering and review of laboratory studies, ordering and review of radiographic studies, pulse oximetry and re-evaluation of patient's condition.  Medications Ordered in ED Medications  heparin bolus via infusion 4,000 Units (has no administration in time range)  heparin ADULT infusion 100 units/mL (25000 units/294mL) (has no administration in time range)  iohexol (OMNIPAQUE) 350 MG/ML injection 100 mL (100 mLs Intravenous Contrast Given 02/14/21 0208)  aspirin chewable tablet  324 mg (324 mg Oral Given 02/14/21 0315)  morphine 4 MG/ML injection 4 mg (4 mg Intravenous Given 02/14/21 0315)    ED Course  I have reviewed the triage vital signs and the nursing notes.  Pertinent labs & imaging results that were available during my care of the patient were reviewed by me and considered in my medical decision making (see chart for details).    MDM Rules/Calculators/A&P                         Chest pain worrisome for ACS.  ECG is normal, but initial troponin is mildly elevated at 95.  Repeat troponin is significantly elevated at 779.  He is given a dose of aspirin and started on heparin.  Additional lab findings include moderate elevation of lipase to 189, elevated ALT of uncertain clinical significance.  Glucose is also mildly elevated at 162.  Chest x-ray showed  bibasilar atelectasis.  CT angiogram had been ordered at triage and showed no acute pathology, specifically showing no evidence of pulmonary embolism or intra-abdominal pathology.  Case is discussed with Dr. Blossom Hoops of cardiology service who agrees to admit the patient.  Old records were reviewed showing negative stress test in 2017, last outpatient visit with cardiology was also in 2017.  Final Clinical Impression(s) / ED Diagnoses Final diagnoses:  Non-STEMI (non-ST elevated myocardial infarction) ([place])  Elevated lipase  Elevated ALT measurement  Elevated blood pressure reading without diagnosis of hypertension  Elevated random blood glucose level    Rx / DC Orders ED Discharge Orders     None        Delora Fuel, MD 16/01/09 3235    Delora Fuel, MD 57/32/20 [phone]

## 2021-02-15 ENCOUNTER — Encounter (HOSPITAL_COMMUNITY): Payer: Self-pay | Admitting: Student

## 2021-02-15 ENCOUNTER — Encounter: Payer: Self-pay | Admitting: Physician Assistant

## 2021-02-15 DIAGNOSIS — E785 Hyperlipidemia, unspecified: Secondary | ICD-10-CM

## 2021-02-15 DIAGNOSIS — I10 Essential (primary) hypertension: Secondary | ICD-10-CM | POA: Diagnosis not present

## 2021-02-15 DIAGNOSIS — Z20822 Contact with and (suspected) exposure to covid-19: Secondary | ICD-10-CM | POA: Diagnosis not present

## 2021-02-15 HISTORY — DX: Hyperlipidemia, unspecified: E78.5

## 2021-02-15 HISTORY — DX: Essential (primary) hypertension: I10

## 2021-02-15 LAB — BASIC METABOLIC PANEL
Anion gap: 9 (ref 5–15)
BUN: 14 mg/dL (ref 6–20)
CO2: 27 mmol/L (ref 22–32)
Calcium: 8.8 mg/dL — ABNORMAL LOW (ref 8.9–10.3)
Chloride: 99 mmol/L (ref 98–111)
Creatinine, Ser: 1.02 mg/dL (ref 0.61–1.24)
GFR, Estimated: >60 mL/min (ref >60)
Glucose, Bld: 125 mg/dL — ABNORMAL HIGH (ref 70–99)
Potassium: 4 mmol/L (ref 3.5–5.1)
Sodium: 135 mmol/L (ref 135–145)

## 2021-02-15 LAB — CBC
HCT: 48.9 % (ref 39.0–52.0)
Hemoglobin: 16.5 g/dL (ref 13.0–17.0)
MCH: 29.7 pg (ref 26.0–34.0)
MCHC: 33.7 g/dL (ref 30.0–36.0)
MCV: 87.9 fL (ref 80.0–100.0)
Platelets: 235 10*3/uL (ref 150–400)
RBC: 5.56 MIL/uL (ref 4.22–5.81)
RDW: 13.5 % (ref 11.5–15.5)
WBC: 7.9 10*3/uL (ref 4.0–10.5)
nRBC: 0 % (ref 0.0–0.2)

## 2021-02-15 MED ORDER — NITROGLYCERIN 0.4 MG SL SUBL: 0.4000 mg | SUBLINGUAL_TABLET | SUBLINGUAL | 12 refills | Status: AC | PRN

## 2021-02-15 MED ORDER — ROSUVASTATIN CALCIUM 40 MG PO TABS: 40.0000 mg | ORAL_TABLET | Freq: Every day | ORAL | 6 refills | Status: DC

## 2021-02-15 MED ORDER — CARVEDILOL 12.5 MG PO TABS: 12.5000 mg | ORAL_TABLET | Freq: Two times a day (BID) | ORAL | 6 refills | Status: DC

## 2021-02-15 MED ORDER — ASPIRIN 81 MG PO TBEC: 81.0000 mg | DELAYED_RELEASE_TABLET | Freq: Every day | ORAL | 11 refills | Status: AC

## 2021-02-15 MED ORDER — TICAGRELOR 90 MG PO TABS: 90.0000 mg | ORAL_TABLET | Freq: Two times a day (BID) | ORAL | 11 refills | Status: DC

## 2021-02-15 MED ORDER — LOSARTAN POTASSIUM 25 MG PO TABS: 25.0000 mg | ORAL_TABLET | Freq: Every day | ORAL | 6 refills | Status: DC

## 2021-02-15 NOTE — Discharge Instructions (Signed)
PLEASE REMEMBER TO BRING ALL OF YOUR MEDICATIONS TO EACH OF YOUR FOLLOW-UP OFFICE VISITS. ° °PLEASE ATTEND ALL SCHEDULED FOLLOW-UP APPOINTMENTS.  ° °Activity: Increase activity slowly as tolerated. You may shower, but no soaking baths (or swimming) for 1 week. No driving for 1 week. No lifting over 5 lbs for 2 weeks. No sexual activity for 1 week.  ° °You May Return to Work: in 3 weeks (if applicable) ° °Wound Care: You may wash cath site gently with soap and water. Keep cath site clean and dry. If you notice pain, swelling, bleeding or pus at your cath site, please call [phone]. ° ° ° °Cardiac Cath Site Care °Refer to this sheet in the next few weeks. These instructions provide you with information on caring for yourself after your procedure. Your caregiver may also give you more specific instructions. Your treatment has been planned according to current medical practices, but problems sometimes occur. Call your caregiver if you have any problems or questions after your procedure. °HOME CARE INSTRUCTIONS °· You may shower 24 hours after the procedure. Remove the bandage (dressing) and gently wash the site with plain soap and water. Gently pat the site dry.  °· Do not apply powder or lotion to the site.  °· Do not sit in a bathtub, swimming pool, or whirlpool for 5 to 7 days.  °· No bending, squatting, or lifting anything over 10 pounds (4.5 kg) as directed by your caregiver.  °· Inspect the site at least twice daily.  °· Do not drive home if you are discharged the same day of the procedure. Have someone else drive you.  °· You may drive 24 hours after the procedure unless otherwise instructed by your caregiver.  °What to expect: °· Any bruising will usually fade within 1 to 2 weeks.  °· Blood that collects in the tissue (hematoma) may be painful to the touch. It should usually decrease in size and tenderness within 1 to 2 weeks.  °SEEK IMMEDIATE MEDICAL CARE IF: °· You have unusual pain at the site or down the  affected limb.  °· You have redness, warmth, swelling, or pain at the site.  °· You have drainage (other than a small amount of blood on the dressing).  °· You have chills.  °· You have a fever or persistent symptoms for more than 72 hours.  °· You have a fever and your symptoms suddenly get worse.  °· Your leg becomes pale, cool, tingly, or numb.  °· You have heavy bleeding from the site. Hold pressure on the site.  °Document Released: 03/14/2010 Document Revised: 01/29/2011 Document Reviewed:  ° °

## 2021-02-15 NOTE — Progress Notes (Signed)
CARDIAC REHAB PHASE I   PRE:  Rate/Rhythm: Sinus Rhythm  BP:  Supine: 132/89       SaO2: 95%  MODE:  Ambulation: 650 ft   POST:  Rate/Rhythem: 103  BP:  Supine: 126/71      SaO2: 97%  0950-1026 Mike Schmidt  walked independently in the hallway without chest pain or shortness of breath. Tolerated well.  Harrell Gave RN

## 2021-02-15 NOTE — Discharge Summary (Addendum)
Discharge Summary    Patient ID: Mike Schmidt MRN: [id number]; DOB: 05/02/1965  Admit date: 02/13/2021 Discharge date: 02/15/2021  PCP:  Merrilee Seashore, MD   [facility] Providers Cardiologist:  Quay Burow, MD        Discharge Diagnoses    Principal Problem:   NSTEMI (non-ST elevated myocardial infarction) [facility]) Active Problems:   Dyslipidemia (high LDL; low HDL)   HTN (hypertension)    Diagnostic Studies/Procedures    ECHO: 02/14/2021  1. Left ventricular ejection fraction, by estimation, is 40 to 45%. Left  ventricular ejection fraction by 3D volume is 47 %. The left ventricle has  mildly decreased function. The left ventricle demonstrates regional wall  motion abnormalities (see  scoring diagram/findings for description). There is moderate concentric  left ventricular hypertrophy. Left ventricular diastolic parameters are  consistent with Grade II diastolic dysfunction (pseudonormalization).  Elevated left atrial pressure. There is  severe hypokinesis of the left ventricular, entire anterolateral wall and  inferolateral wall.   2. Right ventricular systolic function is normal. The right ventricular  size is normal. Tricuspid regurgitation signal is inadequate for assessing  PA pressure.   3. Left atrial size was mildly dilated.   4. The mitral valve is normal in structure. No evidence of mitral valve  regurgitation.   5. The aortic valve is tricuspid. Aortic valve regurgitation is not  visualized. No aortic stenosis is present.   6. The inferior vena cava is dilated in size with <50% respiratory  variability, suggesting right atrial pressure of 15 mmHg.   CARDIAC CATH: 23 2022   1st Diag lesion is 40% stenosed.   Mid Cx lesion is 85% stenosed.   A drug-eluting stent was successfully placed using a STENT ONYX FRONTIER 3.5X26.   Post intervention, there is a 0% residual stenosis.   The left ventricular systolic function is normal.   LV end  diastolic pressure is normal.   The left ventricular ejection fraction is 55-65% by visual estimate.   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 Risk factor modification. Anticipate DC in am if stable.  Intervention   _____________   History of Present Illness     Mike Schmidt is a 55 y.o. male with no previous cardiac history and no known cardiac risk factors.  He had chest pain with exertion and came to the hospital where his troponin elevated to 779.  He was admitted for NSTEMI  Hospital Course     Consultants: None  He was taken to the Cath Lab on 12/23, results are above.  He had a significant circumflex lesion treated with DES.  Nonobstructive LAD disease is to be treated medically.  He tolerated the procedure well.  He had an echocardiogram which showed mildly decreased EF at 40-45% with wall motion abnormalities and grade 2 diastolic dysfunction.  His blood pressure was elevated and he was started on losartan 25 mg a day as well as carvedilol, which was uptitrated to 12.5 mg twice daily at discharge.  He was noted to have dyslipidemia with an elevated LDL and a decreased HDL.  He is on high-dose statin.  On 12/24, he was seen by cardiac rehab and ambulated without chest pain or shortness of breath.  He was evaluated by Dr. Curt Bears and doing well.  No further inpatient work-up is  indicated and he is considered stable for discharge, to follow-up as an outpatient.  Did the patient have an acute coronary syndrome (MI, NSTEMI, STEMI, etc) this admission?:  Yes                               AHA/ACC Clinical Performance & Quality Measures: Aspirin prescribed? - Yes ADP Receptor Inhibitor (Plavix/Clopidogrel, Brilinta/Ticagrelor or Effient/Prasugrel) prescribed (includes medically managed patients)? - Yes Beta Blocker  prescribed? - Yes High Intensity Statin (Lipitor 40-80mg  or Crestor 20-40mg ) prescribed? - Yes EF assessed during THIS hospitalization? - Yes For EF <40%, was ACEI/ARB prescribed? - Not Applicable (EF >/= 02%) For EF <40%, Aldosterone Antagonist (Spironolactone or Eplerenone) prescribed? - Not Applicable (EF >/= 40%) Cardiac Rehab Phase II ordered (including medically managed patients)? - Yes      _____________  Discharge Vitals Blood pressure (!) 131/93, pulse 98, temperature 98.6 F (37 C), temperature source Oral, resp. rate 16, height 5\' 8"  (1.727 m), weight 83.9 kg, SpO2 94 %.  Filed Weights   02/13/21 2307  Weight: 83.9 kg    Labs & Radiologic Studies    CBC Recent Labs    02/14/21 0448 02/15/21 0420  WBC 7.4 7.9  HGB 15.6 16.5  HCT 46.9 48.9  MCV 89.5 87.9  PLT 250 973   Basic Metabolic Panel Recent Labs    02/14/21 0448 02/15/21 0420  NA 135 135  K 4.1 4.0  CL 103 99  CO2 24 27  GLUCOSE 134* 125*  BUN 16 14  CREATININE 0.97 1.02  CALCIUM 8.2* 8.8*   Liver Function Tests Recent Labs    02/13/21 2342  AST 39  ALT 69*  ALKPHOS 59  BILITOT 0.4  PROT 6.4*  ALBUMIN 3.9   Recent Labs    02/13/21 2342  LIPASE 189*   High Sensitivity Troponin:   Recent Labs  Lab 02/13/21 2342 02/14/21 0201  TROPONINIHS 95* 779*    BNP Invalid input(s): POCBNP D-Dimer No results for input(s): DDIMER in the last 72 hours. Hemoglobin A1C Recent Labs    02/14/21 0448  HGBA1C 6.8*   Fasting Lipid Panel Recent Labs    02/14/21 0448  CHOL 158  HDL 40*  LDLCALC 100*  TRIG 92  CHOLHDL 4.0   Thyroid Function Tests Recent Labs    02/14/21 0448  TSH 1.915   _____________  DG Chest 2 View  Result Date: 02/13/2021 CLINICAL DATA:  Left-sided chest pain. EXAM: CHEST - 2 VIEW COMPARISON:  January 03, 2014 FINDINGS: Mildly decreased lung volumes are seen. Mild atelectasis is noted within the bilateral lung bases. There is no evidence of a pleural  effusion or pneumothorax. The heart size and mediastinal contours are within normal limits. The visualized skeletal structures are unremarkable. IMPRESSION: Mild bibasilar atelectasis. Electronically Signed   By: Virgina Norfolk M.D.   On: 02/13/2021 23:54   CARDIAC CATHETERIZATION  Result Date: 02/14/2021   1st Diag lesion is 40% stenosed.   Mid Cx lesion is 85% stenosed.   A drug-eluting stent was successfully placed using a STENT ONYX FRONTIER 3.5X26.   Post intervention, there is a 0% residual stenosis.   The left ventricular systolic function is normal.   LV end diastolic pressure is normal.   The left ventricular ejection fraction is 55-65% by visual estimate. 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 Risk factor modification. Anticipate DC in am if stable.   ECHOCARDIOGRAM COMPLETE  Result Date: 02/14/2021    ECHOCARDIOGRAM REPORT   Patient Name:   Mike Schmidt Date of Exam: 02/14/2021 Medical Rec #:  [id number]        Height:       68.0 in Accession #:    [id number]       Weight:       185.0 lb Date of Birth:  1965/09/16         BSA:          1.977 m Patient Age:    33 years         BP:           164/103 mmHg Patient Gender: M                HR:           81 bpm. Exam Location:  Inpatient Procedure: 2D Echo, Cardiac Doppler and Color Doppler Indications:    Acute MI  History:        Patient has prior history of Echocardiogram examinations, most                 recent 02/20/2014.  Sonographer:    Glo Herring Referring Phys: [id number] [place]  1. Left ventricular ejection fraction, by estimation, is 40 to 45%. Left ventricular ejection fraction by 3D volume is 47 %. The left ventricle has mildly decreased function. The left ventricle demonstrates regional wall motion abnormalities (see scoring diagram/findings for description).  There is moderate concentric left ventricular hypertrophy. Left ventricular diastolic parameters are consistent with Grade II diastolic dysfunction (pseudonormalization). Elevated left atrial pressure. There is severe hypokinesis of the left ventricular, entire anterolateral wall and inferolateral wall.  2. Right ventricular systolic function is normal. The right ventricular size is normal. Tricuspid regurgitation signal is inadequate for assessing PA pressure.  3. Left atrial size was mildly dilated.  4. The mitral valve is normal in structure. No evidence of mitral valve regurgitation.  5. The aortic valve is tricuspid. Aortic valve regurgitation is not visualized. No aortic stenosis is present.  6. The inferior vena cava is dilated in size with <50% respiratory variability, suggesting right atrial pressure of 15 mmHg. FINDINGS  Left Ventricle: Left ventricular ejection fraction, by estimation, is 40 to 45%. Left ventricular ejection fraction by 3D volume is 47 %. The left ventricle has mildly decreased function. The left ventricle demonstrates regional wall motion abnormalities. Severe hypokinesis of the left ventricular, entire anterolateral wall and inferolateral wall. The left ventricular internal cavity size was normal in size. There is moderate concentric left ventricular hypertrophy. Left ventricular diastolic parameters are consistent with Grade II diastolic dysfunction (pseudonormalization). Elevated left atrial pressure.  LV Wall Scoring: The entire lateral wall is hypokinetic. Right Ventricle: The right ventricular size is normal. No increase in right ventricular wall thickness. Right ventricular systolic function is normal. Tricuspid regurgitation signal is inadequate for assessing PA pressure. Left Atrium: Left atrial size was mildly dilated. Right Atrium: Right atrial size was normal in size. Pericardium: There is no evidence of pericardial effusion. Mitral Valve: The mitral valve is normal in  structure. No evidence of mitral valve regurgitation. Tricuspid Valve: The tricuspid valve is normal in structure. Tricuspid valve regurgitation is trivial. Aortic Valve: The aortic valve is tricuspid. Aortic valve regurgitation is not visualized. No  aortic stenosis is present. Aortic valve mean gradient measures 2.0 mmHg. Aortic valve peak gradient measures 3.0 mmHg. Aortic valve area, by VTI measures 2.85 cm. Pulmonic Valve: The pulmonic valve was normal in structure. Pulmonic valve regurgitation is trivial. Aorta: The aortic root and ascending aorta are structurally normal, with no evidence of dilitation. Venous: The inferior vena cava is dilated in size with less than 50% respiratory variability, suggesting right atrial pressure of 15 mmHg. IAS/Shunts: No atrial level shunt detected by color flow Doppler.  LEFT VENTRICLE PLAX 2D LVIDd:         4.40 cm         Diastology LVIDs:         3.00 cm         LV e' medial:    9.36 cm/s LV PW:         1.40 cm         LV E/e' medial:  8.2 LV IVS:        1.40 cm         LV e' lateral:   9.57 cm/s LVOT diam:     2.20 cm         LV E/e' lateral: 8.1 LV SV:         47 LV SV Index:   24 LVOT Area:     3.80 cm        3D Volume EF                                LV 3D EF:    Left                                             ventricul                                             ar                                             ejection                                             fraction                                             by 3D                                             volume is                                             47 %.  3D Volume EF:                                3D EF:        47 % RIGHT VENTRICLE             IVC RV Basal diam:  3.70 cm     IVC diam: 2.00 cm RV Mid diam:    2.30 cm RV S prime:     11.10 cm/s LEFT ATRIUM           Index        RIGHT ATRIUM           Index LA diam:      3.70 cm 1.87 cm/m   RA Area:     17.60 cm LA  Vol (A2C): 32.1 ml 16.24 ml/m  RA Volume:   48.80 ml  24.68 ml/m LA Vol (A4C): 46.2 ml 23.37 ml/m  AORTIC VALVE                    PULMONIC VALVE AV Area (Vmax):    2.63 cm     PV Vmax:       0.71 m/s AV Area (Vmean):   2.73 cm     PV Peak grad:  2.0 mmHg AV Area (VTI):     2.85 cm AV Vmax:           86.20 cm/s AV Vmean:          57.800 cm/s AV VTI:            0.164 m AV Peak Grad:      3.0 mmHg AV Mean Grad:      2.0 mmHg LVOT Vmax:         59.70 cm/s LVOT Vmean:        41.500 cm/s LVOT VTI:          0.123 m LVOT/AV VTI ratio: 0.75  AORTA Ao Root diam: 3.10 cm Ao Asc diam:  2.50 cm MITRAL VALVE MV Area (PHT): 5.16 cm    SHUNTS MV Decel Time: 147 msec    Systemic VTI:  0.12 m MV E velocity: 77.10 cm/s  Systemic Diam: 2.20 cm MV A velocity: 48.40 cm/s MV E/A ratio:  1.59 Mihai Croitoru MD Electronically signed by Sanda Klein MD Signature Date/Time: 02/14/2021/1:56:29 PM    Final    CT Angio Chest/Abd/Pel for Dissection W and/or Wo Contrast  Result Date: 02/14/2021 CLINICAL DATA:  Chest pain or back pain, aortic dissection suspected EXAM: CT ANGIOGRAPHY CHEST, ABDOMEN AND PELVIS TECHNIQUE: Non-contrast CT of the chest was initially obtained. Multidetector CT imaging through the chest, abdomen and pelvis was performed using the standard protocol during bolus administration of intravenous contrast. Multiplanar reconstructed images and MIPs were obtained and reviewed to evaluate the vascular anatomy. CONTRAST:  124mL OMNIPAQUE IOHEXOL 350 MG/ML SOLN COMPARISON:  None. FINDINGS: CTA CHEST FINDINGS Cardiovascular: No significant coronary artery calcification. Global cardiac size within normal limits. No pericardial effusion. Central pulmonary arteries are of normal caliber. There is adequate opacification of the pulmonary arterial tree and no intraluminal filling defect is seen through the segmental level to suggest acute pulmonary embolism. The thoracic aorta is of normal caliber. No intramural hematoma or  dissection. Aberrant right subclavian artery noted. The arch vasculature is widely patent proximally. No significant atherosclerotic calcification. Mediastinum/Nodes: No enlarged mediastinal, hilar, or axillary lymph nodes. Thyroid gland, trachea, and  esophagus demonstrate no significant findings. Lungs/Pleura: Mild right basilar atelectasis. Lungs are otherwise clear. No pneumothorax or pleural effusion. Central airways are widely patent. 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 No acute bone abnormality. Review of the MIP images confirms the above findings. CTA ABDOMEN AND PELVIS FINDINGS VASCULAR Aorta: Normal caliber aorta without aneurysm, dissection, vasculitis or significant stenosis. Celiac: Patent without evidence of aneurysm, dissection, vasculitis or significant stenosis. SMA: Patent without evidence of aneurysm, dissection, vasculitis or significant stenosis. Renals: Both renal arteries are patent without evidence of aneurysm, dissection, vasculitis, fibromuscular dysplasia or significant stenosis. IMA: Patent without evidence of aneurysm, dissection, vasculitis or significant stenosis. Inflow: Patent without evidence of aneurysm, dissection, vasculitis or significant stenosis. Veins: No obvious venous abnormality within the limitations of this arterial phase study. Review of the MIP images confirms the above findings. NON-VASCULAR Hepatobiliary: No focal liver abnormality is seen. No gallstones, gallbladder wall thickening, or biliary dilatation. Pancreas: Unremarkable Spleen: Unremarkable Adrenals/Urinary Tract: Adrenal glands are unremarkable. Kidneys are normal, without renal calculi, focal lesion, or hydronephrosis. Bladder is unremarkable. Stomach/Bowel: Mild descending and sigmoid colonic diverticulosis without superimposed acute inflammatory  change. The stomach, small bowel, and large bowel are otherwise unremarkable. Appendix normal. No free intraperitoneal gas. Lymphatic: No pathologic adenopathy within the abdomen and pelvis. Reproductive: Prostate is unremarkable. Other: No abdominal wall hernia or abnormality. No abdominopelvic ascites. Musculoskeletal: No acute bone abnormality. Degenerative changes are seen at the lumbosacral junction. Review of the MIP images confirms the above findings. IMPRESSION: No evidence of thoracoabdominal aortic aneurysm or dissection. No acute intrathoracic or intra-abdominal pathology identified. No definite radiographic explanation for the patient's reported symptoms. Incidental findings as noted above. Electronically Signed   By: Fidela Salisbury M.D.   On: 02/14/2021 02:37   Disposition   Pt is being discharged home today in good condition.  Follow-up Plans & Appointments     Follow-up Information     Lorretta Harp, MD Follow up.   Specialties: Cardiology, Radiology Why: The office Yehya Brendle call Contact information: [address] [place] [address] [phone]                Discharge Instructions     Amb Referral to Cardiac Rehabilitation   Complete by: As directed    Diagnosis:  Coronary Stents NSTEMI PTCA     After initial evaluation and assessments completed: Virtual Based Care may be provided alone or in conjunction with Phase 2 Cardiac Rehab based on patient barriers.: Yes   Diet - low sodium heart healthy   Complete by: As directed    Increase activity slowly   Complete by: As directed        Discharge Medications   Allergies as of 02/15/2021   No Known Allergies      Medication List     TAKE these medications    aspirin 81 MG EC tablet Take 1 tablet (81 mg total) by mouth daily. Swallow whole. Start taking on: February 16, 2021   carvedilol 12.5 MG tablet Commonly known as: COREG Take 1 tablet (12.5 mg total) by mouth 2 (two)  times daily with a meal.   cetirizine 10 MG tablet Commonly known as: ZYRTEC Take 10 mg by mouth at bedtime.   CINNAMON PO Take 1 capsule by mouth daily.   famciclovir 250 MG tablet Commonly known as: FAMVIR Take 250 mg by mouth  2 (two) times daily.   Fluticasone-Salmeterol 250-50 MCG/DOSE Aepb Commonly known as: ADVAIR Inhale 1 puff into the lungs every 12 (twelve) hours.   GARLIC PO Take 1 capsule by mouth daily.   ibuprofen 200 MG tablet Commonly known as: ADVIL Take 400 mg by mouth every 6 (six) hours as needed for headache or moderate pain.   losartan 25 MG tablet Commonly known as: COZAAR Take 1 tablet (25 mg total) by mouth daily. Start taking on: February 16, 2021   Multivital tablet Take 1 tablet by mouth daily.   nitroGLYCERIN 0.4 MG SL tablet Commonly known as: NITROSTAT Place 1 tablet (0.4 mg total) under the tongue every 5 (five) minutes x 3 doses as needed for chest pain.   OVER THE COUNTER MEDICATION Apply 1 application topically at bedtime as needed (ankle pain). Hempvana max pain relief   rosuvastatin 40 MG tablet Commonly known as: CRESTOR Take 1 tablet (40 mg total) by mouth daily. Start taking on: February 16, 2021   Systane 0.4-0.3 % Gel ophthalmic gel Generic drug: Polyethyl Glycol-Propyl Glycol Place 1 application into both eyes at bedtime.   ticagrelor 90 MG Tabs tablet Commonly known as: BRILINTA Take 1 tablet (90 mg total) by mouth 2 (two) times daily.   Zinc 50 MG Tabs Take 50 mg by mouth daily.           Outstanding Labs/Studies   None  Duration of Discharge Encounter   Greater than 30 minutes including physician time.  Signed, Rosaria Ferries, PA-C 02/15/2021, 2:04 PM  I have seen and examined this patient with Rosaria Ferries.  Agree with above, note added to reflect my findings.  Regular rhythm, no murmurs, lungs clear, no JVD, no lower extremity edema.  Patient presented to the hospital with chest pain, found to  have an elevated troponin consistent with non-STEMI.  His now status post left heart catheterization with circumflex intervention.  We Vallerie Hentz plan for discharge today with follow-up in clinic.  Greater than 30 minutes including physician time were spent on the discharge of this patient.  Tonja Jezewski M. Emnet Monk MD 02/16/2021 11:04 AM

## 2021-02-15 NOTE — Progress Notes (Signed)
Progress Note  Patient Name: Mike Schmidt Date of Encounter: 02/15/2021  [facility] Cardiologist: Dr. Quay Burow  Subjective   Currently feeling well.  No chest pain or shortness of breath.  Able do all of his daily activities.  No complaints at this time.  Inpatient Medications    Scheduled Meds:  aspirin EC  81 mg Oral Daily   carvedilol  6.25 mg Oral BID WC   heparin  5,000 Units Subcutaneous Q8H   loratadine  10 mg Oral Daily   losartan  25 mg Oral Daily   mometasone-formoterol  2 puff Inhalation BID   rosuvastatin  40 mg Oral Daily   sodium chloride flush  3 mL Intravenous Q12H   ticagrelor  90 mg Oral BID   valACYclovir  1,000 mg Oral Daily   Continuous Infusions:  sodium chloride     PRN Meds: sodium chloride, acetaminophen, nitroGLYCERIN, ondansetron (ZOFRAN) IV, sodium chloride flush   Vital Signs    Vitals:   02/15/21 0459 02/15/21 0732 02/15/21 0755 02/15/21 0944  BP: (!) 146/105  (!) 139/98 (!) 131/93  Pulse: 87  (!) 107 98  Resp:    16  Temp: 98.8 F (37.1 C)   98.6 F (37 C)  TempSrc: Oral   Oral  SpO2: 95% 96%  94%  Weight:      Height:        Intake/Output Summary (Last 24 hours) at 02/15/2021 1107 Last data filed at 02/15/2021 0458 Gross per 24 hour  Intake 557.74 ml  Output 2225 ml  Net -1667.26 ml   Last 3 Weights 02/13/2021 11/18/2018 11/04/2018  Weight (lbs) 185 lb 197 lb 197 lb  Weight (kg) 83.915 kg 89.359 kg 89.359 kg      Telemetry    Sinus rhythm-personally reviewed  ECG    Sinus rhythm, rate 88-personally reviewed  Physical Exam   GEN: Well nourished, well developed, in no acute distress  HEENT: normal  Neck: no JVD, carotid bruits, or masses Cardiac: RRR; no murmurs, rubs, or gallops,no edema  Respiratory:  clear to auscultation bilaterally, normal work of breathing GI: soft, nontender, nondistended, + BS MS: no deformity or atrophy  Skin: warm and dry Neuro:  Strength and sensation are  intact Psych: euthymic mood, full affect   Labs    High Sensitivity Troponin:   Recent Labs  Lab 02/13/21 2342 02/14/21 0201  TROPONINIHS 95* 779*      Chemistry Recent Labs  Lab 02/13/21 2342 02/14/21 0448 02/15/21 0420  NA 138 135 135  K 3.7 4.1 4.0  CL 106 103 99  CO2 23 24 27   GLUCOSE 162* 134* 125*  BUN 19 16 14   CREATININE 1.02 0.97 1.02  CALCIUM 8.6* 8.2* 8.8*  PROT 6.4*  --   --   ALBUMIN 3.9  --   --   AST 39  --   --   ALT 69*  --   --   ALKPHOS 59  --   --   BILITOT 0.4  --   --   GFRNONAA >60 >60 >60  ANIONGAP 9 8 9      Lipids  Recent Labs  Lab 02/14/21 0448  CHOL 158  TRIG 92  HDL 40*  LDLCALC 100*  CHOLHDL 4.0     Hematology Recent Labs  Lab 02/13/21 2342 02/14/21 0448 02/15/21 0420  WBC 8.9 7.4 7.9  RBC 5.08 5.24 5.56  HGB 15.1 15.6 16.5  HCT 45.1 46.9 48.9  MCV  88.8 89.5 87.9  MCH 29.7 29.8 29.7  MCHC 33.5 33.3 33.7  RDW 13.3 13.6 13.5  PLT 248 250 235    Thyroid  Recent Labs  Lab 02/14/21 0448  TSH 1.915     BNP Recent Labs  Lab 02/14/21 0448  BNP 26.8     DDimer No results for input(s): DDIMER in the last 168 hours.   Radiology    DG Chest 2 View  Result Date: 02/13/2021 CLINICAL DATA:  Left-sided chest pain. EXAM: CHEST - 2 VIEW COMPARISON:  January 03, 2014 FINDINGS: Mildly decreased lung volumes are seen. Mild atelectasis is noted within the bilateral lung bases. There is no evidence of a pleural effusion or pneumothorax. The heart size and mediastinal contours are within normal limits. The visualized skeletal structures are unremarkable. IMPRESSION: Mild bibasilar atelectasis. Electronically Signed   By: Virgina Norfolk M.D.   On: 02/13/2021 23:54   CARDIAC CATHETERIZATION  Result Date: 02/14/2021   1st Diag lesion is 40% stenosed.   Mid Cx lesion is 85% stenosed.   A drug-eluting stent was successfully placed using a STENT ONYX FRONTIER 3.5X26.   Post intervention, there is a 0% residual stenosis.    The left ventricular systolic function is normal.   LV end diastolic pressure is normal.   The left ventricular ejection fraction is 55-65% by visual estimate. 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 Risk factor modification. Anticipate DC in am if stable.   ECHOCARDIOGRAM COMPLETE  Result Date: 02/14/2021    ECHOCARDIOGRAM REPORT   Patient Name:   Mike Schmidt Date of Exam: 02/14/2021 Medical Rec #:  [id number]        Height:       68.0 in Accession #:    [id number]       Weight:       185.0 lb Date of Birth:  1965-08-04         BSA:          1.977 m Patient Age:    55 years         BP:           164/103 mmHg Patient Gender: M                HR:           81 bpm. Exam Location:  Inpatient Procedure: 2D Echo, Cardiac Doppler and Color Doppler Indications:    Acute MI  History:        Patient has prior history of Echocardiogram examinations, most                 recent 02/20/2014.  Sonographer:    Glo Herring Referring Phys: [id number] [place]  1. Left ventricular ejection fraction, by estimation, is 40 to 45%. Left ventricular ejection fraction by 3D volume is 47 %. The left ventricle has mildly decreased function. The left ventricle demonstrates regional wall motion abnormalities (see scoring diagram/findings for description). There is moderate concentric left ventricular hypertrophy. Left ventricular diastolic parameters are consistent with Grade II diastolic dysfunction (pseudonormalization). Elevated left atrial pressure. There is severe hypokinesis of the left ventricular, entire anterolateral wall and inferolateral wall.  2. Right ventricular systolic function is normal. The right ventricular size is normal. Tricuspid regurgitation signal is inadequate for assessing PA pressure.  3. Left atrial size was mildly dilated.  4. The mitral  valve is normal in structure. No evidence of mitral valve regurgitation.  5. The aortic valve is tricuspid. Aortic valve regurgitation is not visualized. No aortic stenosis is present.  6. The inferior vena cava is dilated in size with <50% respiratory variability, suggesting right atrial pressure of 15 mmHg. FINDINGS  Left Ventricle: Left ventricular ejection fraction, by estimation, is 40 to 45%. Left ventricular ejection fraction by 3D volume is 47 %. The left ventricle has mildly decreased function. The left ventricle demonstrates regional wall motion abnormalities. Severe hypokinesis of the left ventricular, entire anterolateral wall and inferolateral wall. The left ventricular internal cavity size was normal in size. There is moderate concentric left ventricular hypertrophy. Left ventricular diastolic parameters are consistent with Grade II diastolic dysfunction (pseudonormalization). Elevated left atrial pressure.  LV Wall Scoring: The entire lateral wall is hypokinetic. Right Ventricle: The right ventricular size is normal. No increase in right ventricular wall thickness. Right ventricular systolic function is normal. Tricuspid regurgitation signal is inadequate for assessing PA pressure. Left Atrium: Left atrial size was mildly dilated. Right Atrium: Right atrial size was normal in size. Pericardium: There is no evidence of pericardial effusion. Mitral Valve: The mitral valve is normal in structure. No evidence of mitral valve regurgitation. Tricuspid Valve: The tricuspid valve is normal in structure. Tricuspid valve regurgitation is trivial. Aortic Valve: The aortic valve is tricuspid. Aortic valve regurgitation is not visualized. No aortic stenosis is present. Aortic valve mean gradient measures 2.0 mmHg. Aortic valve peak gradient measures 3.0 mmHg. Aortic valve area, by VTI measures 2.85 cm. Pulmonic Valve: The pulmonic valve was normal in structure. Pulmonic valve regurgitation is trivial. Aorta: The  aortic root and ascending aorta are structurally normal, with no evidence of dilitation. Venous: The inferior vena cava is dilated in size with less than 50% respiratory variability, suggesting right atrial pressure of 15 mmHg. IAS/Shunts: No atrial level shunt detected by color flow Doppler.  LEFT VENTRICLE PLAX 2D LVIDd:         4.40 cm         Diastology LVIDs:         3.00 cm         LV e' medial:    9.36 cm/s LV PW:         1.40 cm         LV E/e' medial:  8.2 LV IVS:        1.40 cm         LV e' lateral:   9.57 cm/s LVOT diam:     2.20 cm         LV E/e' lateral: 8.1 LV SV:         47 LV SV Index:   24 LVOT Area:     3.80 cm        3D Volume EF                                LV 3D EF:    Left                                             ventricul  ar                                             ejection                                             fraction                                             by 3D                                             volume is                                             47 %.                                 3D Volume EF:                                3D EF:        47 % RIGHT VENTRICLE             IVC RV Basal diam:  3.70 cm     IVC diam: 2.00 cm RV Mid diam:    2.30 cm RV S prime:     11.10 cm/s LEFT ATRIUM           Index        RIGHT ATRIUM           Index LA diam:      3.70 cm 1.87 cm/m   RA Area:     17.60 cm LA Vol (A2C): 32.1 ml 16.24 ml/m  RA Volume:   48.80 ml  24.68 ml/m LA Vol (A4C): 46.2 ml 23.37 ml/m  AORTIC VALVE                    PULMONIC VALVE AV Area (Vmax):    2.63 cm     PV Vmax:       0.71 m/s AV Area (Vmean):   2.73 cm     PV Peak grad:  2.0 mmHg AV Area (VTI):     2.85 cm AV Vmax:           86.20 cm/s AV Vmean:          57.800 cm/s AV VTI:            0.164 m AV Peak Grad:      3.0 mmHg AV Mean Grad:      2.0 mmHg LVOT Vmax:         59.70 cm/s LVOT Vmean:        41.500 cm/s LVOT VTI:          0.123 m  LVOT/AV  VTI ratio: 0.75  AORTA Ao Root diam: 3.10 cm Ao Asc diam:  2.50 cm MITRAL VALVE MV Area (PHT): 5.16 cm    SHUNTS MV Decel Time: 147 msec    Systemic VTI:  0.12 m MV E velocity: 77.10 cm/s  Systemic Diam: 2.20 cm MV A velocity: 48.40 cm/s MV E/A ratio:  1.59 Mihai Croitoru MD Electronically signed by Sanda Klein MD Signature Date/Time: 02/14/2021/1:56:29 PM    Final    CT Angio Chest/Abd/Pel for Dissection W and/or Wo Contrast  Result Date: 02/14/2021 CLINICAL DATA:  Chest pain or back pain, aortic dissection suspected EXAM: CT ANGIOGRAPHY CHEST, ABDOMEN AND PELVIS TECHNIQUE: Non-contrast CT of the chest was initially obtained. Multidetector CT imaging through the chest, abdomen and pelvis was performed using the standard protocol during bolus administration of intravenous contrast. Multiplanar reconstructed images and MIPs were obtained and reviewed to evaluate the vascular anatomy. CONTRAST:  167mL OMNIPAQUE IOHEXOL 350 MG/ML SOLN COMPARISON:  None. FINDINGS: CTA CHEST FINDINGS Cardiovascular: No significant coronary artery calcification. Global cardiac size within normal limits. No pericardial effusion. Central pulmonary arteries are of normal caliber. There is adequate opacification of the pulmonary arterial tree and no intraluminal filling defect is seen through the segmental level to suggest acute pulmonary embolism. The thoracic aorta is of normal caliber. No intramural hematoma or dissection. Aberrant right subclavian artery noted. The arch vasculature is widely patent proximally. No significant atherosclerotic calcification. Mediastinum/Nodes: No enlarged mediastinal, hilar, or axillary lymph nodes. Thyroid gland, trachea, and esophagus demonstrate no significant findings. Lungs/Pleura: Mild right basilar atelectasis. Lungs are otherwise clear. No pneumothorax or pleural effusion. Central airways are widely patent. 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 No acute bone abnormality. Review of the MIP images confirms the above findings. CTA ABDOMEN AND PELVIS FINDINGS VASCULAR Aorta: Normal caliber aorta without aneurysm, dissection, vasculitis or significant stenosis. Celiac: Patent without evidence of aneurysm, dissection, vasculitis or significant stenosis. SMA: Patent without evidence of aneurysm, dissection, vasculitis or significant stenosis. Renals: Both renal arteries are patent without evidence of aneurysm, dissection, vasculitis, fibromuscular dysplasia or significant stenosis. IMA: Patent without evidence of aneurysm, dissection, vasculitis or significant stenosis. Inflow: Patent without evidence of aneurysm, dissection, vasculitis or significant stenosis. Veins: No obvious venous abnormality within the limitations of this arterial phase study. Review of the MIP images confirms the above findings. NON-VASCULAR Hepatobiliary: No focal liver abnormality is seen. No gallstones, gallbladder wall thickening, or biliary dilatation. Pancreas: Unremarkable Spleen: Unremarkable Adrenals/Urinary Tract: Adrenal glands are unremarkable. Kidneys are normal, without renal calculi, focal lesion, or hydronephrosis. Bladder is unremarkable. Stomach/Bowel: Mild descending and sigmoid colonic diverticulosis without superimposed acute inflammatory change. The stomach, small bowel, and large bowel are otherwise unremarkable. Appendix normal. No free intraperitoneal gas. Lymphatic: No pathologic adenopathy within the abdomen and pelvis. Reproductive: Prostate is unremarkable. Other: No abdominal wall hernia or abnormality. No abdominopelvic ascites. Musculoskeletal: No acute bone abnormality. Degenerative changes are seen at the lumbosacral junction. Review of the MIP images confirms the above findings. IMPRESSION: No evidence of thoracoabdominal aortic aneurysm or  dissection. No acute intrathoracic or intra-abdominal pathology identified. No definite radiographic explanation for the patient's reported symptoms. Incidental findings as noted above. Electronically Signed   By: Fidela Salisbury M.D.   On: 02/14/2021 02:37    Cardiac Studies   TTE  1. Left ventricular ejection fraction, by estimation, is 40 to  45%. Left  ventricular ejection fraction by 3D volume is 47 %. The left ventricle has  mildly decreased function. The left ventricle demonstrates regional wall  motion abnormalities (see  scoring diagram/findings for description). There is moderate concentric  left ventricular hypertrophy. Left ventricular diastolic parameters are  consistent with Grade II diastolic dysfunction (pseudonormalization).  Elevated left atrial pressure. There is  severe hypokinesis of the left ventricular, entire anterolateral wall and  inferolateral wall.   2. Right ventricular systolic function is normal. The right ventricular  size is normal. Tricuspid regurgitation signal is inadequate for assessing  PA pressure.   3. Left atrial size was mildly dilated.   4. The mitral valve is normal in structure. No evidence of mitral valve  regurgitation.   5. The aortic valve is tricuspid. Aortic valve regurgitation is not  visualized. No aortic stenosis is present.   6. The inferior vena cava is dilated in size with <50% respiratory  variability, suggesting right atrial pressure of 15 mmHg.   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 Risk factor modification. Anticipate DC in am if stable.   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  He has no cardiac risk factors.  He developed chest pain yesterday evening and was transported by EMS to [facility] emergency room where he was evaluated and  seen by cardiology.  His enzymes initially were 95 which rose to 779.  He currently is pain-free on IV heparin awaiting cardiac catheterization.  Assessment & Plan    1.  Non-STEMI: Troponin elevated at 779.  ECG with no acute changes.  Patient is status post stenting of the circumflex.  Makynleigh Breslin need dual antiplatelet therapy for 1 year.  2.  Hyperlipidemia: LDL of 100.  Goal less than 70.  Continue Crestor 40 mg.  3.  Hypertension: Currently on losartan 25 mg daily, carvedilol 6.25 mg daily.  Blood pressures well controlled.  Dashiell Franchino increase Coreg to 12.5 mg at discharge.  For questions or updates, please contact [place] Please consult [email or web address] for contact info under        Signed, Laurent Cargile Meredith Leeds, MD  02/15/2021, 11:07 AM

## 2021-02-20 ENCOUNTER — Telehealth (HOSPITAL_COMMUNITY): Payer: Self-pay

## 2021-02-20 NOTE — Telephone Encounter (Signed)
Called patient to see if he is interested in the Cardiac Rehab Program. Patient expressed interest. Explained scheduling process and went over insurance, patient verbalized understanding. Will contact patient for scheduling once f/u has been completed.  °

## 2021-02-25 ENCOUNTER — Telehealth: Payer: Self-pay | Admitting: Cardiovascular Disease

## 2021-02-25 NOTE — Telephone Encounter (Signed)
Patient contacted regarding discharge from [facility] on 02/25/2021.  Patient understands to follow up with provider Dr.Berry on 02/25/2021 at at 10:15 AM at [facility]. Patient understands discharge instructions? Yes Patient understands medications and regiment? Yes Patient understands to bring all medications to this visit? Yes

## 2021-02-25 NOTE — Telephone Encounter (Signed)
Patient scheduled to see Dr. Gwenlyn Found tomorrow at 10:15am for a TOC appt. Patient discharged on 02/15/21

## 2021-02-26 ENCOUNTER — Other Ambulatory Visit: Payer: Self-pay

## 2021-02-26 ENCOUNTER — Ambulatory Visit (INDEPENDENT_AMBULATORY_CARE_PROVIDER_SITE_OTHER): Payer: BC Managed Care – PPO | Admitting: Cardiovascular Disease

## 2021-02-26 ENCOUNTER — Encounter: Payer: Self-pay | Admitting: Cardiovascular Disease

## 2021-02-26 DIAGNOSIS — I10 Essential (primary) hypertension: Secondary | ICD-10-CM | POA: Diagnosis not present

## 2021-02-26 DIAGNOSIS — I214 Non-ST elevation (NSTEMI) myocardial infarction: Secondary | ICD-10-CM | POA: Diagnosis not present

## 2021-02-26 DIAGNOSIS — E785 Hyperlipidemia, unspecified: Secondary | ICD-10-CM

## 2021-02-26 NOTE — Assessment & Plan Note (Signed)
History of essential hypertension blood pressure measured today 134/90.  He is on losartan and carvedilol.

## 2021-02-26 NOTE — Progress Notes (Signed)
02/26/2021 Mike Schmidt   1966-02-15  [id number]  Primary Physician Merrilee Seashore, MD Primary Cardiologist: Lorretta Harp MD Lupe Carney, [place]  HPI:  Mike Schmidt is a 56 y.o. mild to moderately overweight married African-American male father of 44 (1 biologic, for with his wife), grandfather 2 grandchildren who is accompanied by his wife Mike Schmidt today.  He works as a Advertising copywriter.  He is here for his first post hospital follow-up visit.  He was recently hospitalized at the end of December with a non-STEMI.  His troponins rose to 779.  He underwent diagnostic coronary angiography by Dr. Martinique revealing a high-grade mid AV groove circumflex stenosis which was stented with a 3.5 mm x 26 mm long Medtronic frontier drug-eluting stent.  He had no other significant CAD.  He said no recurrent symptoms.  He remains on aspirin and Brilinta.  Other problems include treated hypertension, diabetes and hyperlipidemia.  His brother did have CAD as well.   Current Meds  Medication Sig   aspirin EC 81 MG EC tablet Take 1 tablet (81 mg total) by mouth daily. Swallow whole.   carvedilol (COREG) 12.5 MG tablet Take 1 tablet (12.5 mg total) by mouth 2 (two) times daily with a meal.   cetirizine (ZYRTEC) 10 MG tablet Take 10 mg by mouth at bedtime.     CINNAMON PO Take 1 capsule by mouth daily.   famciclovir (FAMVIR) 250 MG tablet Take 250 mg by mouth 2 (two) times daily.   Fluticasone-Salmeterol (ADVAIR) 250-50 MCG/DOSE AEPB Inhale 1 puff into the lungs every 12 (twelve) hours.     GARLIC PO Take 1 capsule by mouth daily.   losartan (COZAAR) 25 MG tablet Take 1 tablet (25 mg total) by mouth daily.   Multiple Vitamins-Minerals (MULTIVITAL) tablet Take 1 tablet by mouth daily.     OVER THE COUNTER MEDICATION Apply 1 application topically at bedtime as needed (ankle pain). Hempvana max pain relief   Polyethyl Glycol-Propyl Glycol (SYSTANE) 0.4-0.3 % GEL ophthalmic gel  Place 1 application into both eyes at bedtime.   rosuvastatin (CRESTOR) 40 MG tablet Take 1 tablet (40 mg total) by mouth daily.   ticagrelor (BRILINTA) 90 MG TABS tablet Take 1 tablet (90 mg total) by mouth 2 (two) times daily.   Zinc 50 MG TABS Take 50 mg by mouth daily.     No Known Allergies  Social History   Socioeconomic History   Marital status: Married    Spouse name: Not on file   Number of children: Not on file   Years of education: Not on file   Highest education level: Not on file  Occupational History   Not on file  Tobacco Use   Smoking status: Former    Types: Cigarettes    Quit date: 2007    Years since quitting: 16.0   Smokeless tobacco: Never  Vaping Use   Vaping Use: Never used  Substance and Sexual Activity   Alcohol use: No   Drug use: Not Currently   Sexual activity: Not on file  Other Topics Concern   Not on file  Social History Narrative   Not on file   Social Determinants of Health   Financial Resource Strain: Not on file  Food Insecurity: Not on file  Transportation Needs: Not on file  Physical Activity: Not on file  Stress: Not on file  Social Connections: Not on file  Intimate Partner Violence: Not on  file     Review of Systems: General: negative for chills, fever, night sweats or weight changes.  Cardiovascular: negative for chest pain, dyspnea on exertion, edema, orthopnea, palpitations, paroxysmal nocturnal dyspnea or shortness of breath Dermatological: negative for rash Respiratory: negative for cough or wheezing Urologic: negative for hematuria Abdominal: negative for nausea, vomiting, diarrhea, bright red blood per rectum, melena, or hematemesis Neurologic: negative for visual changes, syncope, or dizziness All other systems reviewed and are otherwise negative except as noted above.    Blood pressure 134/90, pulse 73, height 5\' 8"  (1.727 m), weight 201 lb 9.6 oz (91.4 kg), SpO2 97 %.  General appearance: alert and no  distress Neck: no adenopathy, no carotid bruit, no JVD, supple, symmetrical, trachea midline, and thyroid not enlarged, symmetric, no tenderness/mass/nodules Lungs: clear to auscultation bilaterally Heart: regular rate and rhythm, S1, S2 normal, no murmur, click, rub or gallop Extremities: extremities normal, atraumatic, no cyanosis or edema Pulses: 2+ and symmetric Skin: Skin color, texture, turgor normal. No rashes or lesions Neurologic: Grossly normal  EKG sinus rhythm at 73 with inferolateral T wave inversion.  I personally reviewed this EKG.  ASSESSMENT AND PLAN:   NSTEMI (non-ST elevated myocardial infarction) ([place]) History of non-STEMI on 02/14/2021.  He underwent radial diagnostic cath by Dr. Martinique with stenting of the AV groove circumflex with a Medtronic frontier 3.5 x 26 mm long drug-eluting stent.  His EF by visual estimation was 55 to 65% although his 2D echo suggested an EF in the 40 to 45% range.  He is on dual antiplatelet therapy including aspirin and Brilinta.  He has had no recurrent symptoms.  His troponins did go up to 779.  Dyslipidemia (high LDL; low HDL) History of dyslipidemia with lipid profile performed 02/14/2021 revealing total cholesterol 158, LDL 100 and HDL 40.  He was begun on high-dose rosuvastatin.  We will recheck a lipid liver profile in 3 months.  HTN (hypertension) History of essential hypertension blood pressure measured today 134/90.  He is on losartan and carvedilol.     Lorretta Harp MD FACP,FACC,FAHA, [facility] 02/26/2021 10:57 AM

## 2021-02-26 NOTE — Patient Instructions (Addendum)
Medication Instructions:  Your physician recommends that you continue on your current medications as directed. Please refer to the Current Medication list given to you today.  *If you need a refill on your cardiac medications before your next appointment, please call your pharmacy*   Lab Work: Your physician recommends that you return for lab work in: 3 months for Fasting Lipid/liver profile.  If you have labs (blood work) drawn today and your tests are completely normal, you will receive your results only by: [place] (if you have MyChart) OR A paper copy in the mail If you have any lab test that is abnormal or we need to change your treatment, we will call you to review the results.   Testing/Procedures: Your physician has requested that you have an echocardiogram. Echocardiography is a painless test that uses sound waves to create images of your heart. It provides your doctor with information about the size and shape of your heart and how well your hearts chambers and valves are working. This procedure takes approximately one hour. There are no restrictions for this procedure. To be done in April 2023. This procedure is done at [address].     Follow-Up: At [facility], you and your health needs are our priority.  As part of our continuing mission to provide you with exceptional heart care, we have created designated Provider Care Teams.  These Care Teams include your primary Cardiologist (physician) and Advanced Practice Providers (APPs -  Physician Assistants and Nurse Practitioners) who all work together to provide you with the care you need, when you need it.  We recommend signing up for the patient portal called "MyChart".  Sign up information is provided on this After Visit Summary.  MyChart is used to connect with patients for Virtual Visits (Telemedicine).  Patients are able to view lab/test results, encounter notes, upcoming appointments, etc.  Non-urgent messages  can be sent to your provider as well.   To learn more about what you can do with MyChart, go to [email or web address].    Your next appointment:   3 month(s)  The format for your next appointment:   In Person  Provider:   Quay Burow, MD    Other Instructions  Heart-Healthy Eating Plan Heart-healthy meal planning includes: Eating less unhealthy fats. Eating more healthy fats. Making other changes in your diet. What are tips for following this plan? Cooking Avoid frying your food. Try to bake, boil, grill, or broil it instead. You can also reduce fat by: Removing the skin from poultry. Removing all visible fats from meats. Steaming vegetables in water or broth. Meal planning  At meals, divide your plate into four equal parts: Fill one-half of your plate with vegetables and green salads. Fill one-fourth of your plate with whole grains. Fill one-fourth of your plate with lean protein foods. Eat 4-5 servings of vegetables per day. A serving of vegetables is: 1 cup of raw or cooked vegetables. 2 cups of raw leafy greens. Eat 4-5 servings of fruit per day. A serving of fruit is: 1 medium whole fruit.  cup of dried fruit.  cup of fresh, frozen, or canned fruit.  cup of 100% fruit juice. Eat more foods that have soluble fiber. These are apples, broccoli, carrots, beans, peas, and barley. Try to get 20-30 g of fiber per day. Eat 4-5 servings of nuts, legumes, and seeds per week: 1 serving of dried beans or legumes equals  cup after being cooked. 1 serving of nuts  is  cup. 1 serving of seeds equals 1 tablespoon. General information Eat more home-cooked food. Eat less restaurant, buffet, and fast food. Limit or avoid alcohol. Limit foods that are high in starch and sugar. Avoid fried foods. Lose weight if you are overweight. Keep track of how much salt (sodium) you eat. This is important if you have high blood pressure. Ask your doctor to tell you more about  this. Try to add vegetarian meals each week. Fats Choose healthy fats. These include olive oil and canola oil, flaxseeds, walnuts, almonds, and seeds. Eat more omega-3 fats. These include salmon, mackerel, sardines, tuna, flaxseed oil, and ground flaxseeds. Try to eat fish at least 2 times each week. Check food labels. Avoid foods with trans fats or high amounts of saturated fat. Limit saturated fats. These are often found in animal products, such as meats, butter, and cream. These are also found in plant foods, such as palm oil, palm kernel oil, and coconut oil. Avoid foods with partially hydrogenated oils in them. These have trans fats. Examples are stick margarine, some tub margarines, cookies, crackers, and other baked goods. What foods can I eat? Fruits All fresh, canned (in natural juice), or frozen fruits. Vegetables Fresh or frozen vegetables (raw, steamed, roasted, or grilled). Green salads. Grains Most grains. Choose whole wheat and whole grains most of the time. Rice and pasta, including brown rice and pastas made with whole wheat. Meats and other proteins Lean, well-trimmed beef, veal, pork, and lamb. Chicken and [place] without skin. All fish and shellfish. Wild duck, rabbit, pheasant, and venison. Egg whites or low-cholesterol egg substitutes. Dried beans, peas, lentils, and tofu. Seeds and most nuts. Dairy Low-fat or nonfat cheeses, including ricotta and mozzarella. Skim or 1% milk that is liquid, powdered, or evaporated. Buttermilk that is made with low-fat milk. Nonfat or low-fat yogurt. Fats and oils Non-hydrogenated (trans-free) margarines. Vegetable oils, including soybean, sesame, sunflower, olive, peanut, safflower, corn, canola, and cottonseed. Salad dressings or mayonnaise made with a vegetable oil. Beverages Mineral water. Coffee and tea. Diet carbonated beverages. Sweets and desserts Sherbet, gelatin, and fruit ice. Small amounts of dark chocolate. Limit all sweets  and desserts. Seasonings and condiments All seasonings and condiments. The items listed above may not be a complete list of foods and drinks you can eat. Contact a dietitian for more options. What foods should I avoid? Fruits Canned fruit in heavy syrup. Fruit in cream or butter sauce. Fried fruit. Limit coconut. Vegetables Vegetables cooked in cheese, cream, or butter sauce. Fried vegetables. Grains Breads that are made with saturated or trans fats, oils, or whole milk. Croissants. Sweet rolls. Donuts. High-fat crackers, such as cheese crackers. Meats and other proteins Fatty meats, such as hot dogs, ribs, sausage, bacon, rib-eye roast or steak. High-fat deli meats, such as salami and bologna. Caviar. Domestic duck and goose. Organ meats, such as liver. Dairy Cream, sour cream, cream cheese, and creamed cottage cheese. Whole-milk cheeses. Whole or 2% milk that is liquid, evaporated, or condensed. Whole buttermilk. Cream sauce or high-fat cheese sauce. Yogurt that is made from whole milk. Fats and oils Meat fat, or shortening. Cocoa butter, hydrogenated oils, palm oil, coconut oil, palm kernel oil. Solid fats and shortenings, including bacon fat, salt pork, lard, and butter. Nondairy cream substitutes. Salad dressings with cheese or sour cream. Beverages Regular sodas and juice drinks with added sugar. Sweets and desserts Frosting. Pudding. Cookies. Cakes. Pies. Milk chocolate or white chocolate. Buttered syrups. Full-fat ice cream or ice  cream drinks. The items listed above may not be a complete list of foods and drinks to avoid. Contact a dietitian for more information. Summary Heart-healthy meal planning includes eating less unhealthy fats, eating more healthy fats, and making other changes in your diet. Eat a balanced diet. This includes fruits and vegetables, low-fat or nonfat dairy, lean protein, nuts and legumes, whole grains, and heart-healthy oils and fats. This information is not  intended to replace advice given to you by your health care provider. Make sure you discuss any questions you have with your health care provider. Document Revised: 06/20/2020 Document Reviewed: 06/20/2020 Elsevier Patient Education  2022 [employer].

## 2021-02-26 NOTE — Assessment & Plan Note (Signed)
History of dyslipidemia with lipid profile performed 02/14/2021 revealing total cholesterol 158, LDL 100 and HDL 40.  He was begun on high-dose rosuvastatin.  We will recheck a lipid liver profile in 3 months.

## 2021-02-26 NOTE — Assessment & Plan Note (Signed)
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  He underwent radial diagnostic cath by Dr. [place] with stenting of the AV groove circumflex with a Medtronic frontier 3.5 x 26 mm long drug-eluting stent.  His EF by visual estimation was 55 to 65% although his 2D echo suggested an EF in the 40 to 45% range.  He is on dual antiplatelet therapy including aspirin and Brilinta.  He has had no recurrent symptoms.  His troponins did go up to 779.

## 2021-02-27 ENCOUNTER — Telehealth: Payer: Self-pay | Admitting: Cardiovascular Disease

## 2021-02-27 ENCOUNTER — Encounter: Payer: Self-pay | Admitting: *Deleted

## 2021-02-27 DIAGNOSIS — Z006 Encounter for examination for normal comparison and control in clinical research program: Secondary | ICD-10-CM

## 2021-02-27 NOTE — Research (Signed)
V-Inception Research Study  Patient Contacted about potential participation in [employer] V-Inception.  Study was discussed with the patient and the opportunity to ask questions was given.  Patient was emailed or paper mailed a copy of the consent.    Patient wanted to go ahead and make his screening visit appointment.  His appointment is for Monday January 9th at 2 am.  Appointment details have been emailed to patient and patient was instructed to contact me if he has any questions about the consent.   This patient is eligible to participate in V-Inception Study.  The study is comparing the initiation of Inclisiran on top of usual care in Patient's with a recent acute coronary syndrome within the past 5 weeks.  Eligibility Criteria: recent ACS within 5 weeks, Serum LCL-C greater than or equal to 70mg /dL or non-HDL-C greater than or equal to 100mg /dL, and taking statin therapy or statin intolerant (not on PCSK9 Inhibitors).  Its a randomized, controlled, multicenter, open-label trial comparing a hospital post-discharge care pathway involving aggressive LCL-C management that includes Inclisiran with usual care versus usual care alone in patients with a recent ACS.   Jasmine Pang, RN BSN [facility] Cardiovascular Research & Education Direct Line: [phone]

## 2021-02-27 NOTE — Telephone Encounter (Signed)
Mickel Baas with [employer] states she received notice stating patient is cleared to return to work as of 02/26/21. However, she states they are needing a statement specifically stating that the patient may return to work with no restrictions. Please include name/DOB and effective date.  Phone #: [phone] Fax#: [phone]

## 2021-02-28 NOTE — Telephone Encounter (Signed)
Left message for Laura to call back.

## 2021-03-03 ENCOUNTER — Other Ambulatory Visit: Payer: Self-pay

## 2021-03-03 ENCOUNTER — Encounter: Payer: BC Managed Care – PPO | Admitting: *Deleted

## 2021-03-03 VITALS — BP 138/79 | HR 78 | Resp 16 | Ht 68.0 in | Wt 201.0 lb

## 2021-03-03 DIAGNOSIS — Z006 Encounter for examination for normal comparison and control in clinical research program: Secondary | ICD-10-CM

## 2021-03-03 NOTE — Research (Signed)
Screening Visit 1  V-Inception Informed Consent   Subject Name: Mike Schmidt Subject met inclusion and exclusion criteria.  The informed consent form, study requirements and expectations were reviewed with the subject and questions and concerns were addressed prior to the signing of the consent form.  The subject verbalized understanding of the trial requirements.  The subject agreed to participate in the V-Inception trial and signed the informed consent at 1020 on 03-Mar-2021.  The informed consent was obtained prior to performance of any protocol-specific procedures for the subject.  A copy of the signed informed consent was given to the subject and a copy was placed in the subject's medical record.    Jasmine Pang, RN BSN [facility] Cardiovascular Research & Education Direct Line: [phone]   Did this Visit Occur? [x]   Yes   OR     []  NO  Date of Visit Date _09__/_JAN___/_2023____         DD/MON/YYYY  Type of Visit [x] Planned or []  Unplanned [x]  Visit 1 Screening []  Visit 2 Baseline []  Visit 3 []  Visit 4 []  Visit 5/EOS   Protocol Version number under which subject entered study Protocol Version ____04______  Was Study Informed Consent obtained [x]   Yes   OR     []  NO  Date of Study Informed Consent Date _09__/_JAN___/_2023____           DD/MON/YYYY   Subject Re-Screening (collected Visit 1 only) Is the Subject being re-screened? []   Yes   OR     [x]  NO  Site ID (where subject was previously screened) _______  Subject Number (previously screened) ______    V-Inception Inclusion Criteria Screening Yes [x]    No[]  Males and females ? 54 years of age   Yes [x]  No[]  Recent ACS (inpatient/outpatient) within 5 weeks of screening, defined as: Ischemic symptoms with unstable pattern, occurring at rest or minimal exertion within 24 hours of an unscheduled hospital admission or emergency room visit, due to presumed or proven obstructive coronary disease and at  least one of the following:  Elevated cardiac biomarkers (Cardiac Troponin (cTn) or the MB fraction of Creatinine Kinase (CKMB)) with at least one value above the 99th percentile of the upper reference limit (URL) or defined by the local laboratory MI diagnosis cut-off values OR Resting ECG changes consistent with ischemia or infarction AND additional evidence of obstructive coronary disease.   Yes [x]  No[]  Serum LDL-C ? 40m/dL or non-HDL ? 100 mg/dL   Yes [x]  No[]  Fasting triglycerides <4.52 mmol/L (<400 at screening)   Yes [x]  No[]  Calculated glomerular filtration rate >20 mL/min by estimated glomerular filtration (eGFR)   Yes [x]  No[]  Participants willing to give consent before initiation of any study related procedures and willing to comply with all required study procedures   Yes [x]  No[]  Participants are required to be on statin therapy, or have documented statin intolerance, as determined by the investigator, following hospitalization (inpatient/outpatient) for an ACS. Statin intolerant patients are eligible if they had intolerable side effects on at least 2 different statins, including one at the lowest standard dose.     Exclusion Criteria Screening Yes []  No[x]  Any uncontrolled or serious disease, or any medical or surgical condition, that may either interfere with participation in the clinical study, and/or put the participant at significant risk (according to investigator's [or delegate] judgment) if he/she participates in the clinical study   Yes []  No[x]  An underlying known disease, or surgical, physical, or medical condition that, in the  opinion of the investigator (or delegate) might interfere with interpretation of the clinical study results.   Yes []  No[x]  New York Heart Association (NYHA) class IIIb or IV heart failure or last known left ventricular ejection fraction <25%. Significant cardiac arrhythmia within 3 months prior to randomization that is not controlled by medication  or via ablation at the time of screening.   Yes []  No[x]  Uncontrolled severe hypertension: systolic blood pressure >161 mmHg or diastolic blood pressure >096 mmHg prior to randomization (assessed at screening visit) despite antihypertensive therapy   Yes []  No[x]  Recurrent acute coronary syndrome event within 2 weeks prior to randomization   Yes []  No[x]  Coronary angiography and revascularization procedure (PCI or CABG surgery) performed within 2 weeks prior to the randomization visit or planned after randomization   Yes []  No[x]  Severe concomitant non-cardiovascular disease that carries the risk of reducing life expectancy to less than 2 years   Yes []  No[x]  History of malignancy that required surgery (excluding local and wide-local excision), radiation therapy and/or systemic therapy during the three years prior to randomization.  Yes []  No[x]  Women of child-bearing potential, defined as all women physiologically capable of becoming pregnant, unless they are using basic methods of contraception during dosing of investigational drug   Method of Choice: ___N/A___________________________  Total abstinence (when this is in line with the preferred and usual lifestyle of the participant). Periodic abstinence (e.g., calendar, ovulation, symptothermal, post-ovulation methods) and withdrawal are not acceptable methods of contraception  Male sterilization (have had surgical bilateral oophorectomy with or          without hysterectomy), total hysterectomy or tubal ligation at least six weeks before taking investigational drug. In case of oophorectomy alone, only when the reproductive status of the woman has been confirmed by follow up hormone level assessment  Male sterilization (at least 6 months prior to screening). For male participants on the study, the vasectomized male partner should be the sole partner for that subject  Barrier methods of contraception: Condom or Occlusive cap (diaphragm or  cervical/vault caps)  Use of oral (estrogen and progesterone), injected or implanted hormonal methods of contraception or other forms of hormonal contraception that have comparable efficacy (failure rate <1%), for example hormone vaginal ring or transdermal hormone contraception or placement of an intrauterine device (IUD) or intrauterine system (IUS)  In case of use of oral contraception women should have been stable on the same pill for a minimum of 3 months before taking investigational drug. Women are considered post-menopausal and not of child bearing potential if they have had 12 months of natural (spontaneous) amenorrhea with an appropriate clinical profile (e.g., age appropriate, history of vasomotor symptoms) or have had surgical bilateral oophorectomy (with or without hysterectomy), total hysterectomy or tubal ligation at least six weeks ago. In  the case of oophorectomy alone, only when the reproductive status of the woman has been confirmed by follow up hormone level assessment is she considered not of child bearing potential.    Yes []  No[x]  Treatment with other investigational products or devices within 30 days or five half?live of the screening visit, whichever is longer.   Yes []  No[x]  History of hypersensitivity to any of the study treatments or its excipients or to drugs of similar chemical classes.   Yes []  No[x]  Planned use of other investigational products or devices during the course of the study   Yes []  No[x]  Any condition that according to the investigator could interfere with the conduct of the  study,  such as but not limited to: a. Participants who are unable to communicate or to cooperate with the  investigator. b. Unable to understand the protocol requirements, instructions and study-related  restrictions, the nature, scope, and possible consequences of the study  (including participants whose cooperation is doubtful due to drug abuse or  alcohol dependency). c.  Unlikely to comply with the protocol requirements, instructions, and study-related restrictions (e.g., uncooperative attitude, inability to return for follow-up visits, and improbability of completing the study). d. Have any medical or surgical condition, which in the opinion of the  investigator would put the participant at increased risk from participating in the  study. e. Persons directly involved in the conduct of the study   Yes []  No[x]  Treatment with monoclonal antibodies directed towards PCSK9 within 90 days of screening.   Yes []  No[x]  Active liver disease defined as any known current infectious, neoplastic, or metabolic pathology of the liver or (ii) alanine aminotransferase (ALT) elevation >3x ULN, aspartate aminotransferase (AST) elevation >3x ULN, or total bilirubin elevation >2x ULN (except patients with Gilbert's syndrome) at screening confirmed by a repeat measurement at least one week apart.     Subject Age __55_____ Years  Subject Sex [x] Male  OR  []  Male  Subject Childbearing Potential []   Yes   OR     [x]  NO  Ethnicity []  Hispanic or Latino  [x]  Not Hispanic or Latino []  Not Reported []  Unknown  Race [] White [x] Black or African American [] Asian []  American [place] or [place] Native []  Native Hawaiian or Other [place] []  Unknown  Source or Subject Referral [] Advocacy Group [] Clinical Trial Registry [x]  ER Visit or Hospital []  Non-[employer] Internet Sites []  Newsletter/Educational []  Material [] [employer] Internet Site []  [facility] [] Print Advertisement []  Physician Referral []  Radio Advertisement []  Television Advertisement []  Other []  Unknown   Acute Coronary Syndrome (ACS) Medical History  Date of Discharge Hospitalization for Index ACS Event _24_/_DEC_/_2022___ DD/MMM/YYYY  Does Subject have a history of elevated cardiac biomarkers (cardiac troponin cTn) or MB fraction of creatinine kinase (CKMB) with at least one value  above the 99th percentile of the upper reference limit (URL) or defined by the local laboratory MI diagnosis cut off values   [x]   Yes   OR     []  NO  Does the Subject have history of resting ECG changes consistent with ischemia or infarction AND additional evidence of obstructive coronary disease   []   Yes   OR     [x]  NO  Type of Imagining performed []  MRI [x] CT Scan [x]  Cardiac Cath []  Other_____________ ____________________   Index ACS Event Classification Note: Any responses that are Yes in this section, do not need to be entered in the medical History other CRF  Did the Subject have a MI [x]   Yes   OR     []  NO []  STEMI   [x]  NSTEMI []  Unstable Angina PCI during Event?  [x]   Yes   OR     []  NO Systolic pressure during Index Forensic psychologist Diastolic Pressure during Index MI _103_______ Atrial Fibrillation during Index MI  [] Yes[]  NO   Unstable Angina []   Yes   OR     [x]  NO Location_______________________________________________________ ________________________________________________________________  ST segment elevation myocardial infarction (STEMI) []   Yes   OR     [x]  NO Location________________________________________________________ ________________________________________________________________  Non-ST-segment elevation myocardial infarction (NSTEMI) [x]   Yes   OR     []  NO  Location_______________________________________________________ ________________________________________________________________   Revascularization Procedure Associated with ACS Event   Did the subject undergo a revascularization procedure during the Index ACS Event [x]   Yes   OR   []  NO   Type of revascularization procedure   PCI with Stent _____________________________________________  Date of PCI with Stent Placement procedure _23___/_DEC____/__2022_____ DD/MMM/YYYY  Date of PCI without Stent Placement procedure ____/_____/_______ DD/MMM/YYYY  Date of CABG procedure  ____/_____/_______ DD/MMM/YYYY    Previous Medical History   Does the Subject have a history of MI prior to the Index ACS Event []   Yes  OR   [x]  NO   Date of Most recent MI prior to Index ACS Event  ____/_____/______ DD/MMM/YYYY  Does the subject have a history of stroke []   Yes   OR   [x]  NO   Does the subject have a history of peripheral vascular disease []   Yes  OR   [x]  NO   Does the subject have a history of hypertension []   Yes   OR   [x]  NO   Does the subject have a history of diabetes [x]   Yes   OR   []  NO    Post ACS Status at Discharge   Left Ventricular Ejection fraction (LVEF) Result___45_________  Blood Pressure _136___/_94____  Renal Function (eGFR) ___60____ml/min/1.73 m2  Lipid-C Lowering Therapy (meds taken at time of index MI  _None___     Ezetimibe dose ____None_________________________________  History of other CV medications prior to Index ACS Event []  Beta-Blocker []  Antiplatelet []  ACEi or ARB [] Mineralocorticoid antagonists [] Oral Loop Diuretics []  Anticoagulants [] Unknown [x] None   Statin Intolerance   Statin Drug Name   Any Medical history of Statin Intolerance []   Yes   OR   [x]  NO    Drug Name:_________________________ Indication:__________________________ Dose:_______________________________ Route:______________________________ Unit:_______________________________ Other Unit Specify_____________________ Frequency:___________________________   Where there any documented symptoms of intolerance related to this statin at this dose []   Yes   OR   [x]  NO If yes:________________________________ _____________________________________   If other symptoms     Were there any documented abnormal laboratory findings related to this statin at this dose []   Yes   OR   [x]  NO   Please record all abnormal laboratory findings related to this statin at this dose. Check all that Apply []  []  []  []      Was smoking status assessed [x]   Yes   OR    []  NO   Type of Substance []  Tobacco - []   Yes   OR   [x]  NO                     [] Former                     []  Current                     [] Never []  Chewing Tobacco - []   Yes OR [x]  NO                     [] Former                     []  Current                     [] Never []  Marijuana - []   Yes   OR   [x]  NO                     []   Former                     []  Current                     [] Never [] Vape - []   Yes   OR   [x]  NO                     [] Former                     []  Current                      [] Never []  Other_______________________________    Vital Signs   Date of Measurement _09___/_Jan____/_2023______ DD/MMM/YYYY  Height/Hight Unit and Weight/Weight Units ____68___________ inches _______________ Kilograms  Pulse Rate _____73__________ Beats per Minute  Systolic Blood Pressure ___161__________   Diastolic Blood Pressure _____09__________  Blood Pressure Position _Sitting_______________   Was Central Laboratory Assessment performed [x]   Yes   OR   []  NO Date of Collection: 09_/_Jan_/__2023_                               DD/MMM/YYYY Was the Patient Fasting:  [x]   Yes   OR   []  NO Last date and time of solid foods: _08_/_JAN_/_2023_       _23___ : _00_      DD/MMM/YYYY               24 Hour  Accession Number [id number]  Is the subject of childbearing potential? []   Yes   OR   [x]  NO   Serum Pregnancy Test: Was Central laboratory assessment performed?  (Visit 1 and Unscheduled) []   Yes   OR   [x]  NO Reason Not done:_____Male____________________  Hematology: Was Central Laboratory assessment performed? (Visit 1, 2, 5, and unscheduled) [x]   Yes   OR   []  NO Reason Not done:_________________________  Hepatitis: Was Central Laboratory assessment performed? (Visit1 and Unscheduled) [x]   Yes   OR   []  NO Reason Not done:_________________________  Chemistry: Was Central Laboratory Assessment performed? (Visit 1, 2, 3, 4, 5 and Unscheduled) [x]   Yes   OR   []   NO Reason Not done:_________________________  Lipoprotein(s): Was Central Laboratory Assessment performed?  (Visit 1, 2, 3, 4, 5 and Unscheduled) [x]   Yes   OR   []  NO Reason Not done:_________________________  Coagulation: Was Central Laboratory Assessment performed? (Visit 1, 2, and unscheduled) [x]   Yes   OR   []  NO Reason Not done:_________________________  Fasting Glucose: Was Central Laboratory Assessment performed?  (Visit 1, 2, 3, 4, 5 and Unscheduled) [x]   Yes   OR   []  NO Reason Not done:_________________________  BioBank Sample: Was Central Laboratory Assessment performed?  (Visit 2, 3, 4, 5 and Unscheduled) []   Yes   OR   [x]  NO Reason Not done:______Visit 1____   Was Electrocardiogram (ECG) performed? (Visit 1 and Unscheduled) [x]   Yes   OR   []  NO Reason Not done:______________________  Date of Assessment _09___/_JAN____/__2023_____ DD/MMM/YYYY  Time of Assessment __1122______ (24 Hour Format)  Any Clinically Significant abnormalities []   Yes   OR   [x]  NO     Were there any Adverse Events Experienced? Yes []    No [x]    If yes, complete the following Description of event: __________________________________________________________________ __________________________________________________________________ __________________________________________________________________ __________________________________________________________________ __________________________________________________________________ __________________________________________________________________  Is this AE related to an injection site Reaction Yes []     No [x]   If yes please provide information below Anatomical Location: ___________________ Laterality: ____________________________ Bruising: Yes []    No []  Erythema: Yes []   No []  Induration: Yes []    No []  Lipodystrophy: Yes  []   No []  Necrosis: Yes  []   No []  Oedema: Yes []   No []  Pain: Yes  []   No []  Pallor/Pigment Change: Yes []     No  []  Phlebitis: Yes  []  No []  Purtitus:   Yes []    No []  Rash: Yes []    No []  Tenderness:  Yes []    No []  Ulceration: Yes  []  No []  Warmth: Yes []    No []  Additional Symptoms: ________________________________  _________________________________  Was the Adverse Event serious Yes []    No []   Serious Criteria   Start Date ____/_____/_______  DD/MMM/YYYY  End Date ____/_____/_______  DD/MMM/YYYY  Outcome []  Resolved/Recovered []  Not Recovered/Not Resolved []  Fatal  Severity  []  Mild []  Moderate []  Severe  Relationship to Study Treatment __________________________________________________________________  Action Treatment with Study Treatment []  No Action Taken [] Dose Interrupted []  Drug Withdrawal []  Not Applicable []  Unknown  Was a concomitant or additional treatment given due to this adverse event Yes  []   No []  (if yes, add meds/procedures to list)  If AE led to study discontinuation select Yes Yes  []   No []    Were any Surgical/Medical Procedures reported since signing of Consent Yes []    No[x]    Surgery/Procedure: _____________________  Indication: ___________________________  Start Date: ____/_____/_______                     DD/MMM/YYYY Ongoing: Yes []    No[]   End Date: ____/_____/_______                    DD/MMM/YYYY   Has the subject been hospitalized since Qualifying Hospitalization Yes []    No[x]    Elective []  Planned []  Unplanned []  Date of Admission: ____/_____/_______                         DD/MMM/YYYY Reason for Admission:__________________ ____________________________________ _____________________________________  Was Subject Discharged: Yes []    No[]  Date of Discharge: ____/_____/_______                                DD/MMM/YYYY Discharge Disposition: _________________    Total number of days in intensive care unit or coronary care unit:________________   Has the subject had any emergency room visits (for less than 24 hours) Yes []    No[x]     Date of Visit: ____/_____/_______                         DD/MMM/YYYY Was Visit for an AE: Yes []    No[]  Reason for Visit:__________________ ____________________________________ _____________________________________ Was a concomitant or additional treatment given due to this visit: Yes []    No[]    Has the subject had any other clinical or healthcare professional consultations or visits Yes []    No[x]    Professional Type: ____________________  Date of Visit: ____/_____/_______                         DD/MMM/YYYY Was visit an Adverse Event: Yes []    No[]  Reason for Visit: _____________________  Was a concomitant or additional treatment given due to this visit: Yes []    No[]      Outpatient Encounter Medications as of 03/03/2021  Medication Sig Note   aspirin EC 81 MG EC tablet Take 1 tablet (81 mg total) by mouth daily. Swallow whole.    carvedilol (COREG) 12.5 MG tablet Take 1 tablet (12.5 mg total) by mouth 2 (two) times daily with a meal.    cetirizine (ZYRTEC) 10 MG tablet Take 10 mg by mouth at bedtime.      CINNAMON PO Take 1 capsule by mouth daily.    famciclovir (FAMVIR) 250 MG tablet Take 250 mg by mouth 2 (two) times daily. 02/14/2021: Continuously    Fluticasone-Salmeterol (ADVAIR) 250-50 MCG/DOSE AEPB Inhale 1 puff into the lungs every 12 (twelve) hours.      GARLIC PO Take 1 capsule by mouth daily.    ibuprofen (ADVIL) 200 MG tablet Take 400 mg by mouth every 6 (six) hours as needed for headache or moderate pain. (Patient not taking: Reported on 02/26/2021)    losartan (COZAAR) 25 MG tablet Take 1 tablet (25 mg total) by mouth daily.    Multiple Vitamins-Minerals (MULTIVITAL) tablet Take 1 tablet by mouth daily.      nitroGLYCERIN (NITROSTAT) 0.4 MG SL tablet Place 1 tablet (0.4 mg total) under the tongue every 5 (five) minutes x 3 doses as needed for chest pain. (Patient not taking: Reported on 02/26/2021) 02/26/2021: Patient has on hand if needed. Need a refill   OVER THE  COUNTER MEDICATION Apply 1 application topically at bedtime as needed (ankle pain). Hempvana max pain relief    Polyethyl Glycol-Propyl Glycol (SYSTANE) 0.4-0.3 % GEL ophthalmic gel Place 1 application into both eyes at bedtime.    rosuvastatin (CRESTOR) 40 MG tablet Take 1 tablet (40 mg total) by mouth daily.    ticagrelor (BRILINTA) 90 MG TABS tablet Take 1 tablet (90 mg total) by mouth 2 (two) times daily.    Zinc 50 MG TABS Take 50 mg by mouth daily.    No facility-administered encounter medications on file as of 03/03/2021.      Form Based on: CFR Completion Guidelines Trial: [id number] Number 2.0 Template Version 4.0, Template Effective Date: 06-Jan-2019

## 2021-03-07 NOTE — Research (Signed)
V-Inception Screening Visit Labs  Are there any labs that are clinically significant?  Yes [x]  OR No[]   Is the patient eligible to continue enrollment in the study after screening visit?  Yes []   OR No[x]    V-Inception Screening Phase   Date of Screening Phase _09___/_Jan____/__2023_____ DD/MMM/YYYY  Subject Status []  Completed [x]  Screen Failure []  Adverse Event (subject prevented from getting study drug at Baseline. Only if the adverse event is considered an SAE, ensure it has been entered on the AE CRF and report to [employer]  []  Death (only if occurred during screening phase) []  Lost to Follow-Up []  Physician Decision []  Pregnancy []  Progressive Disease [] Protocol Deviation []  Study Terminated by Sponsor []  Technical Problems []  Subject Decision []  COVID  Specific Decision (physician decision and subject decision)  ________________________________ _________________________________  Provide COVID-19 Relationship _________________________________  Date of Death []  Not Applicable or ____/_____/_______                                     DD/MMM/YYYY  Cause of Death []  Not Applicable or Cause ___________________________ __________________________________________________________________    Abnormal Labs below please Review

## 2021-03-10 NOTE — Research (Signed)
V-Inception Labs from Screening Visit  Are there any labs that are clinically significant?  Yes [x]  OR No[]   Is the patient eligible to continue enrollment in the study after screening visit?  Yes []   OR No[x]    V-Inception Screening Phase   Date of Screening Phase _09___/_JAN____/__2022_____ DD/MMM/YYYY  Subject Status []  Completed [x]  Screen Failure []  Adverse Event (subject prevented from getting study drug at Baseline. Only if the adverse event is considered an SAE, ensure it has been entered on the AE CRF and report to [employer]  []  Death (only if occurred during screening phase) []  Lost to Follow-Up []  Physician Decision []  Pregnancy []  Progressive Disease [] Protocol Deviation []  Study Terminated by Sponsor []  Technical Problems []  Subject Decision []  COVID  Specific Decision (physician decision and subject decision)  ________________________________ _________________________________  Provide COVID-19 Relationship _________________________________  Date of Death []  Not Applicable or ____/_____/_______                                     DD/MMM/YYYY  Cause of Death []  Not Applicable or Cause ___________________________ __________________________________________________________________

## 2021-03-17 DIAGNOSIS — R053 Chronic cough: Secondary | ICD-10-CM | POA: Diagnosis not present

## 2021-03-17 DIAGNOSIS — I2510 Atherosclerotic heart disease of native coronary artery without angina pectoris: Secondary | ICD-10-CM | POA: Diagnosis not present

## 2021-03-17 DIAGNOSIS — E782 Mixed hyperlipidemia: Secondary | ICD-10-CM | POA: Diagnosis not present

## 2021-03-17 DIAGNOSIS — R7301 Impaired fasting glucose: Secondary | ICD-10-CM | POA: Diagnosis not present

## 2021-03-18 DIAGNOSIS — I2510 Atherosclerotic heart disease of native coronary artery without angina pectoris: Secondary | ICD-10-CM | POA: Diagnosis not present

## 2021-03-18 DIAGNOSIS — E782 Mixed hyperlipidemia: Secondary | ICD-10-CM | POA: Diagnosis not present

## 2021-03-18 DIAGNOSIS — R7301 Impaired fasting glucose: Secondary | ICD-10-CM | POA: Diagnosis not present

## 2021-03-28 DIAGNOSIS — R7301 Impaired fasting glucose: Secondary | ICD-10-CM | POA: Diagnosis not present

## 2021-03-28 DIAGNOSIS — E782 Mixed hyperlipidemia: Secondary | ICD-10-CM | POA: Diagnosis not present

## 2021-03-28 DIAGNOSIS — I2510 Atherosclerotic heart disease of native coronary artery without angina pectoris: Secondary | ICD-10-CM | POA: Diagnosis not present

## 2021-04-09 ENCOUNTER — Ambulatory Visit: Payer: BC Managed Care – PPO | Admitting: General Practice

## 2021-04-25 ENCOUNTER — Telehealth: Payer: Self-pay

## 2021-04-25 NOTE — Telephone Encounter (Signed)
? ?  Pre-operative Risk Assessment  ?  ?Patient Name: Mike Schmidt  ?DOB: [birth year] ?MRN: [id number]  ? ? ? ?Request for Surgical Clearance   ? ?Procedure:   Deep cleaning with Anesthetic  ? ?Date of Surgery:  Clearance TBD                              ?   ?Surgeon:  Dr. Ysidro Evert or Dr. Cristi Loron ?Surgeon's Group or Practice Name:  [facility]  ?Phone number:  [phone] ?Fax number:  [phone] ?  ?Type of Clearance Requested:   ?- Medical  ?- Pharmacy:  Hold Aspirin and Ticagrelor (Brilinta) instructions from office on when to hold and restart  ?  ?Type of Anesthesia:   Decide what type of Lidocaine  ?  ?Additional requests/questions:   If patient  needs antibiotic prophylaxis ? ?Signed, ?Jacqulynn Cadet   ?04/25/2021, 2:59 PM  ? ?

## 2021-04-25 NOTE — Telephone Encounter (Signed)
? ?  Patient Name: Mike Schmidt  ?DOB: [birth year] ?MRN: [id number] ? ?Primary Cardiologist: Quay Burow, MD ? ?Chart reviewed as part of pre-operative protocol coverage.  ? ?IF SIMPLE EXTRACTION/CLEANINGS: Simple dental extractions are considered low risk procedures per guidelines and generally do not require any specific cardiac clearance. It is also generally accepted that for simple extractions and dental cleanings, there is no need to interrupt blood thinner therapy. ? ? ?SBE prophylaxis is not required for the patient from a cardiac standpoint. ? ?I will route this recommendation to the requesting party via Epic fax function and remove from pre-op pool. ? ?Please call with questions. ? ?Elgie Collard, PA-C ?04/25/2021, 4:17 PM ? ?

## 2021-04-28 NOTE — Telephone Encounter (Signed)
Received call from [place] from [facility] requesting clarification for this request.  ?She requests clearance for deep cleaning with anesthetic. Asks if epinephrine should be avoided? ?Also, advised that they do not request that patient hold their anticoagulation for this procedure. She requests a specific time frame that it is safe for patient to undergo deep cleaning s/p MI. ? ?Patient underwent LHC on 02/14/21 which revealed  1st Diag lesion is 40% stenosed. ?  Mid Cx lesion is 85% stenosed. ?  A drug-eluting stent was successfully placed using a STENT ONYX FRONTIER 3.5X26. ?  Post intervention, there is a 0% residual stenosis. ?  The left ventricular systolic function is normal. ?  LV end diastolic pressure is normal. ?  The left ventricular ejection fraction is 55-65% by visual estimate. ?  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  ?Plan: DAPT for one year. Risk factor modification. Anticipate DC in am if stable.  the following: ? ?He was last seen by Dr. Gwenlyn Found on 02/26/21. Will route to primary cardiologist for advisement regarding use of epinephrine and if Dr. Gwenlyn Found recommends that patient is safe to proceed. ?  ?

## 2021-04-29 NOTE — Telephone Encounter (Signed)
Attempted to call [facility].  ?Patient may proceed with dental cleaning at any time.  ?

## 2021-04-29 NOTE — Telephone Encounter (Signed)
Requesting Office is calling back stating they are needing the clearance re-faxed with it being added in the notes that Dr. Gwenlyn Found is okay for them to proceed with this 3 months after heart attack instead of 6 months. Please advise.  ?

## 2021-04-29 NOTE — Telephone Encounter (Signed)
Faxed the notes to [facility] office to see notes from pre op provider.  ?

## 2021-04-29 NOTE — Telephone Encounter (Signed)
Per primary cardiologist, Dr. Gwenlyn Found: ?OK to get deep cleaning dental procedure and use local epi. Can NOT hold anti platelet agents ? ?Routing clearance note to requesting office and will close encounter. ?

## 2021-05-05 ENCOUNTER — Encounter (HOSPITAL_COMMUNITY): Payer: Self-pay

## 2021-05-05 ENCOUNTER — Telehealth (HOSPITAL_COMMUNITY): Payer: Self-pay

## 2021-05-05 NOTE — Telephone Encounter (Signed)
Attempted to call patient in regards to Cardiac Rehab - LM on VM Mailed letter 

## 2021-05-05 NOTE — Telephone Encounter (Signed)
Pt insurance is active and benefits verified through [employer]. Co-pay $0.00, DED $3,000.00/$2,185.25 met, out of pocket $8,000.00/$2,185.25 met, co-insurance 20%. No pre-authorization required. Christina/[employer], 31/3/23 @ 3:13PM, XMD#[id number] ?  ?Will contact patient to see if he is interested in the Cardiac Rehab Program.  ?

## 2021-05-20 ENCOUNTER — Telehealth (HOSPITAL_COMMUNITY): Payer: Self-pay

## 2021-05-20 NOTE — Telephone Encounter (Signed)
No response from pt regarding CR.  Closed referral.  

## 2021-05-30 ENCOUNTER — Telehealth: Payer: BC Managed Care – PPO | Admitting: Cardiovascular Disease

## 2021-05-30 ENCOUNTER — Telehealth: Payer: Self-pay | Admitting: Cardiovascular Disease

## 2021-05-30 ENCOUNTER — Ambulatory Visit (HOSPITAL_COMMUNITY): Payer: BC Managed Care – PPO | Attending: Cardiovascular Disease

## 2021-05-30 DIAGNOSIS — I214 Non-ST elevation (NSTEMI) myocardial infarction: Secondary | ICD-10-CM

## 2021-05-30 DIAGNOSIS — E785 Hyperlipidemia, unspecified: Secondary | ICD-10-CM | POA: Diagnosis not present

## 2021-05-30 DIAGNOSIS — I10 Essential (primary) hypertension: Secondary | ICD-10-CM | POA: Insufficient documentation

## 2021-05-30 LAB — ECHOCARDIOGRAM COMPLETE
Area-P 1/2: 3.19 cm2
S' Lateral: 3.2 cm

## 2021-05-30 MED ORDER — CLOPIDOGREL BISULFATE 75 MG PO TABS: 600.0000 mg | ORAL_TABLET | Freq: Once | ORAL | 0 refills | Status: AC

## 2021-05-30 MED ORDER — CLOPIDOGREL BISULFATE 75 MG PO TABS: 75.0000 mg | ORAL_TABLET | Freq: Every day | ORAL | 3 refills | Status: DC

## 2021-05-30 NOTE — Telephone Encounter (Signed)
-  Pt called stating he is unable to afford Brilinta and report out of pocket expense is $400 ?-He state he has applied for pt assistance but was told he does note qualify. ? ?Will forward to MD for recommendations.   ?

## 2021-05-30 NOTE — Telephone Encounter (Signed)
Patient dropped off Continuance of disability form to be completed . ?Patient states that paper work should have been dated return to work January 16th  2023, and that he feels it was an error on our part . Placed paperwork in Provider box for review. Patient would like a call back with status of paperwork. ? ? ?

## 2021-05-30 NOTE — Telephone Encounter (Signed)
Pt c/o medication issue: ? ?1. Name of Medication: ticagrelor (BRILINTA) 90 MG TABS tablet ? ?2. How are you currently taking this medication (dosage and times per day)? As directed ? ?3. Are you having a reaction (difficulty breathing--STAT)? no ? ?4. What is your medication issue? Cost ? ?Patient can not afford his medication. He wants to know if there is an alternate medication he could be prescribed. The patient uses [id number]- [place] [place] - [address]?

## 2021-05-30 NOTE — Telephone Encounter (Signed)
-  Pt updated with MD's recommendations and verbalized understanding. ?-Brilinta D/C ?-Orders for loading dose Plavix 600 mg for one dose, then 75 mg daily thereafter sent to requested pharmacy.  ? ?Lorretta Harp, MD  You 42 minutes ago (3:30 PM)  ? ?He needs to start clopidogrel right away.  He will need a 600 mg loading dose followed by 75 mg a day.   ? ?

## 2021-06-06 DIAGNOSIS — E785 Hyperlipidemia, unspecified: Secondary | ICD-10-CM | POA: Diagnosis not present

## 2021-06-06 LAB — LIPID PANEL
Chol/HDL Ratio: 2.4 ratio (ref 0.0–5.0)
Cholesterol, Total: 91 mg/dL — ABNORMAL LOW (ref 100–199)
HDL: 38 mg/dL — ABNORMAL LOW (ref >39)
LDL Chol Calc (NIH): 39 mg/dL (ref 0–99)
Triglycerides: 62 mg/dL (ref 0–149)
VLDL Cholesterol Cal: 14 mg/dL (ref 5–40)

## 2021-06-06 LAB — HEPATIC FUNCTION PANEL
ALT: 72 IU/L — ABNORMAL HIGH (ref 0–44)
AST: 51 IU/L — ABNORMAL HIGH (ref 0–40)
Albumin: 4.7 g/dL (ref 3.8–4.9)
Alkaline Phosphatase: 55 IU/L (ref 44–121)
Bilirubin Total: 0.6 mg/dL (ref 0.0–1.2)
Bilirubin, Direct: 0.21 mg/dL (ref 0.00–0.40)
Total Protein: 6.6 g/dL (ref 6.0–8.5)

## 2021-06-10 ENCOUNTER — Ambulatory Visit: Payer: BC Managed Care – PPO | Admitting: Cardiovascular Disease

## 2021-06-13 ENCOUNTER — Encounter: Payer: Self-pay | Admitting: Cardiovascular Disease

## 2021-06-13 ENCOUNTER — Ambulatory Visit (INDEPENDENT_AMBULATORY_CARE_PROVIDER_SITE_OTHER): Payer: BC Managed Care – PPO | Admitting: Cardiovascular Disease

## 2021-06-13 DIAGNOSIS — I255 Ischemic cardiomyopathy: Secondary | ICD-10-CM | POA: Diagnosis not present

## 2021-06-13 DIAGNOSIS — E785 Hyperlipidemia, unspecified: Secondary | ICD-10-CM

## 2021-06-13 DIAGNOSIS — I214 Non-ST elevation (NSTEMI) myocardial infarction: Secondary | ICD-10-CM | POA: Diagnosis not present

## 2021-06-13 DIAGNOSIS — I10 Essential (primary) hypertension: Secondary | ICD-10-CM | POA: Diagnosis not present

## 2021-06-13 DIAGNOSIS — I428 Other cardiomyopathies: Secondary | ICD-10-CM | POA: Insufficient documentation

## 2021-06-13 NOTE — Assessment & Plan Note (Signed)
History of non-STEMI 02/14/2021.  He underwent radial diagnostic cath by Dr. Martinique revealing a high-grade AV groove circumflex stenosis which was stented with a 3.5 mm x 26 mm long Medtronic frontier drug-eluting stent.  He remains on dual antiplatelet therapy.  He denies chest pain or shortness of breath.  He had no other significant CAD. ?

## 2021-06-13 NOTE — Assessment & Plan Note (Signed)
History of dyslipidemia on statin therapy with lipid profile performed 06/06/2021 revealing total cholesterol 91, LDL 39 and HDL 38. ?

## 2021-06-13 NOTE — Patient Instructions (Signed)
Medication Instructions:  ?Your physician recommends that you continue on your current medications as directed. Please refer to the Current Medication list given to you today. ? ?*If you need a refill on your cardiac medications before your next appointment, please call your pharmacy* ? ? ?Follow-Up: ?At [facility], you and your health needs are our priority.  As part of our continuing mission to provide you with exceptional heart care, we have created designated Provider Care Teams.  These Care Teams include your primary Cardiologist (physician) and Advanced Practice Providers (APPs -  Physician Assistants and Nurse Practitioners) who all work together to provide you with the care you need, when you need it. ? ?We recommend signing up for the patient portal called "MyChart".  Sign up information is provided on this After Visit Summary.  MyChart is used to connect with patients for Virtual Visits (Telemedicine).  Patients are able to view lab/test results, encounter notes, upcoming appointments, etc.  Non-urgent messages can be sent to your provider as well.   ?To learn more about what you can do with MyChart, go to [email or web address].   ? ?Your next appointment:   ?6 month(s) ? ?The format for your next appointment:   ?In Person ? ?Provider:   ?Jesse Cleaver, FNP, Angela Duke, PA-C, Callie Goodrich, PA-C, Jennifer, Lambert, PA-C, Kathryn Lawrence, DNP, ANP, or Hao Meng, PA-C     ? ? ?Then, Jonathan Berry, MD will plan to see you again in 12 month(s).  ?

## 2021-06-13 NOTE — Progress Notes (Signed)
? ? ? ?06/13/2021 ?Mike Schmidt   ?10/02/1965  ?[id number] ? ?Primary Physician Merrilee Seashore, MD ?Primary Cardiologist: Lorretta Harp MD Mike Schmidt, [place] ? ?HPI:  Mike Schmidt is a 56 y.o.  mild to moderately overweight married African-American male father of 20 (1 biologic, for with his wife), grandfather 2 grandchildren who I last saw in the office 02/26/2021.  He works as a Advertising copywriter.  He is here for his first post hospital follow-up visit.  He was recently hospitalized at the end of December with a non-STEMI.  His troponins rose to 779.  He underwent diagnostic coronary angiography by Dr. Martinique revealing a high-grade mid AV groove circumflex stenosis which was stented with a 3.5 mm x 26 mm long Medtronic frontier drug-eluting stent.  He had no other significant CAD.  He said no recurrent symptoms.  He remains on aspirin and Brilinta.  Other problems include treated hypertension, diabetes and hyperlipidemia.  His brother did have CAD as well. ? ?Since I saw him for months ago he continues to do well.  He is very active walking for an hour 5 days a week.  He denies chest pain or shortness of breath.  2D echo performed/7/23 revealed improvement in LV function from 40 to 45% back in December up to 55% currently. ? ? ?Current Meds  ?Medication Sig  ? aspirin EC 81 MG EC tablet Take 1 tablet (81 mg total) by mouth daily. Swallow whole.  ? carvedilol (COREG) 12.5 MG tablet Take 1 tablet (12.5 mg total) by mouth 2 (two) times daily with a meal.  ? cetirizine (ZYRTEC) 10 MG tablet Take 10 mg by mouth at bedtime.    ? CINNAMON PO Take 1 capsule by mouth daily.  ? clopidogrel (PLAVIX) 75 MG tablet Take 1 tablet (75 mg total) by mouth daily.  ? famciclovir (FAMVIR) 250 MG tablet Take 250 mg by mouth 2 (two) times daily.  ? Fluticasone-Salmeterol (ADVAIR) 250-50 MCG/DOSE AEPB Inhale 1 puff into the lungs every 12 (twelve) hours.    ? GARLIC PO Take 1 capsule by mouth daily.  ?  ibuprofen (ADVIL) 200 MG tablet Take 400 mg by mouth every 6 (six) hours as needed for headache or moderate pain.  ? losartan (COZAAR) 25 MG tablet Take 1 tablet (25 mg total) by mouth daily.  ? Multiple Vitamins-Minerals (MULTIVITAL) tablet Take 1 tablet by mouth daily.    ? nitroGLYCERIN (NITROSTAT) 0.4 MG SL tablet Place 1 tablet (0.4 mg total) under the tongue every 5 (five) minutes x 3 doses as needed for chest pain.  ? OVER THE COUNTER MEDICATION Apply 1 application topically at bedtime as needed (ankle pain). Hempvana max pain relief  ? Polyethyl Glycol-Propyl Glycol (SYSTANE) 0.4-0.3 % GEL ophthalmic gel Place 1 application into both eyes at bedtime.  ? rosuvastatin (CRESTOR) 40 MG tablet Take 1 tablet (40 mg total) by mouth daily.  ? Zinc 50 MG TABS Take 50 mg by mouth daily.  ?  ? ?No Known Allergies ? ?Social History  ? ?Socioeconomic History  ? Marital status: Married  ?  Spouse name: Not on file  ? Number of children: Not on file  ? Years of education: Not on file  ? Highest education level: Not on file  ?Occupational History  ? Not on file  ?Tobacco Use  ? Smoking status: Former  ?  Types: Cigarettes  ?  Quit date: 2007  ?  Years since quitting: 16.3  ?  Smokeless tobacco: Never  ?Vaping Use  ? Vaping Use: Never used  ?Substance and Sexual Activity  ? Alcohol use: No  ? Drug use: Not Currently  ? Sexual activity: Not on file  ?Other Topics Concern  ? Not on file  ?Social History Narrative  ? Not on file  ? ?Social Determinants of Health  ? ?Financial Resource Strain: Not on file  ?Food Insecurity: Not on file  ?Transportation Needs: Not on file  ?Physical Activity: Not on file  ?Stress: Not on file  ?Social Connections: Not on file  ?Intimate Partner Violence: Not on file  ?  ? ?Review of Systems: ?General: negative for chills, fever, night sweats or weight changes.  ?Cardiovascular: negative for chest pain, dyspnea on exertion, edema, orthopnea, palpitations, paroxysmal nocturnal dyspnea or shortness  of breath ?Dermatological: negative for rash ?Respiratory: negative for cough or wheezing ?Urologic: negative for hematuria ?Abdominal: negative for nausea, vomiting, diarrhea, bright red blood per rectum, melena, or hematemesis ?Neurologic: negative for visual changes, syncope, or dizziness ?All other systems reviewed and are otherwise negative except as noted above. ? ? ? ?Blood pressure 136/72, pulse 87, height '5\' 8"'$  (1.727 m), weight 192 lb 12.8 oz (87.5 kg), SpO2 97 %.  ?General appearance: alert and no distress ?Neck: no adenopathy, no carotid bruit, no JVD, supple, symmetrical, trachea midline, and thyroid not enlarged, symmetric, no tenderness/mass/nodules ?Lungs: clear to auscultation bilaterally ?Heart: regular rate and rhythm, S1, S2 normal, no murmur, click, rub or gallop ?Extremities: extremities normal, atraumatic, no cyanosis or edema ?Pulses: 2+ and symmetric ?Skin: Skin color, texture, turgor normal. No rashes or lesions ?Neurologic: Grossly normal ? ?EKG not performed today ? ?ASSESSMENT AND PLAN:  ? ?NSTEMI (non-ST elevated myocardial infarction) ([place]) ?History of non-STEMI 02/14/2021.  He underwent radial diagnostic cath by Dr. Martinique revealing a high-grade AV groove circumflex stenosis which was stented with a 3.5 mm x 26 mm long Medtronic frontier drug-eluting stent.  He remains on dual antiplatelet therapy.  He denies chest pain or shortness of breath.  He had no other significant CAD. ? ?Dyslipidemia (high LDL; low HDL) ?History of dyslipidemia on statin therapy with lipid profile performed 06/06/2021 revealing total cholesterol 91, LDL 39 and HDL 38. ? ?HTN (hypertension) ?History of essential hypertension a blood pressure measured today at 136/72.  He is on carvedilol and losartan. ? ?Ischemic cardiomyopathy ?History of ischemic cardiomyopathy with initial EF at the time of his non-STEMI of 40 to 45%.  He is on carvedilol and losartan.  2D echo performed/7/23 revealed improvement in his LV  function up to 55% with hypokinesia of the basal mid lateral wall. ? ? ? ? ?Lorretta Harp MD FACP,FACC,FAHA, FSCAI ?06/13/2021 ?2:22 PM ?

## 2021-06-13 NOTE — Assessment & Plan Note (Signed)
History of essential hypertension a blood pressure measured today at 136/72.  He is on carvedilol and losartan. ?

## 2021-06-13 NOTE — Assessment & Plan Note (Signed)
History of ischemic cardiomyopathy with initial EF at the time of his non-STEMI of 40 to 45%.  He is on carvedilol and losartan.  2D echo performed/7/23 revealed improvement in his LV function up to 55% with hypokinesia of the basal mid lateral wall. ?

## 2021-08-08 ENCOUNTER — Other Ambulatory Visit: Payer: Self-pay | Admitting: Physician Assistant

## 2021-08-11 DIAGNOSIS — Z23 Encounter for immunization: Secondary | ICD-10-CM | POA: Diagnosis not present

## 2021-08-22 DIAGNOSIS — Z0000 Encounter for general adult medical examination without abnormal findings: Secondary | ICD-10-CM | POA: Diagnosis not present

## 2021-08-22 DIAGNOSIS — Z125 Encounter for screening for malignant neoplasm of prostate: Secondary | ICD-10-CM | POA: Diagnosis not present

## 2021-08-25 ENCOUNTER — Telehealth: Payer: Self-pay | Admitting: Cardiovascular Disease

## 2021-08-25 NOTE — Telephone Encounter (Signed)
Spoke to patient he stated he is upset his insurance denied payment for echo.Stated they told him it was not medically necessary.Advised I will send a message to our insurance dept to re file echo.I will send message to Dr.Berry's RN.

## 2021-08-25 NOTE — Telephone Encounter (Signed)
Patient wants to talk with Dr. Gwenlyn Found or nurse in regards to getting an echocardiogram. Mentioned that insurance do not think it is not medically necessary for him to get it. Please call back

## 2021-08-27 DIAGNOSIS — I2510 Atherosclerotic heart disease of native coronary artery without angina pectoris: Secondary | ICD-10-CM | POA: Diagnosis not present

## 2021-08-27 DIAGNOSIS — Z0000 Encounter for general adult medical examination without abnormal findings: Secondary | ICD-10-CM | POA: Diagnosis not present

## 2021-08-27 DIAGNOSIS — E782 Mixed hyperlipidemia: Secondary | ICD-10-CM | POA: Diagnosis not present

## 2021-08-27 DIAGNOSIS — Z23 Encounter for immunization: Secondary | ICD-10-CM | POA: Diagnosis not present

## 2021-08-27 DIAGNOSIS — R7303 Prediabetes: Secondary | ICD-10-CM | POA: Diagnosis not present

## 2021-08-29 ENCOUNTER — Encounter: Payer: Self-pay | Admitting: Gastroenterology

## 2021-09-08 NOTE — Telephone Encounter (Signed)
Mychart message sent to pt with response from billing dept.

## 2021-10-10 ENCOUNTER — Encounter: Payer: Self-pay | Admitting: Gastroenterology

## 2021-10-10 ENCOUNTER — Ambulatory Visit (INDEPENDENT_AMBULATORY_CARE_PROVIDER_SITE_OTHER): Payer: BC Managed Care – PPO | Admitting: Gastroenterology

## 2021-10-10 VITALS — BP 124/70 | HR 90 | Ht 68.0 in | Wt 195.0 lb

## 2021-10-10 DIAGNOSIS — Z7902 Long term (current) use of antithrombotics/antiplatelets: Secondary | ICD-10-CM

## 2021-10-10 DIAGNOSIS — Z86010 Personal history of colonic polyps: Secondary | ICD-10-CM

## 2021-10-10 NOTE — Patient Instructions (Signed)
Contact our office back after the first of the year to discuss colonoscopy.   _______________________________________________________  If you are age 54 or older, your body mass index should be between 23-30. Your Body mass index is 29.65 kg/m. If this is out of the aforementioned range listed, please consider follow up with your Primary Care Provider.  If you are age 29 or younger, your body mass index should be between 19-25. Your Body mass index is 29.65 kg/m. If this is out of the aformentioned range listed, please consider follow up with your Primary Care Provider.   ________________________________________________________  The [facility] providers would like to encourage you to use [facility] to communicate with providers for non-urgent requests or questions.  Due to long hold times on the telephone, sending your provider a message by [facility] may be a faster and more efficient way to get a response.  Please allow 48 business hours for a response.  Please remember that this is for non-urgent requests.  _______________________________________________________

## 2021-10-10 NOTE — Progress Notes (Signed)
10/10/2021 Mike Schmidt [id number] 02-Feb-1966   HISTORY OF PRESENT ILLNESS: This is a 56 year old male who is a patient of Dr. Woodward Ku.  He came in today to schedule colonoscopy.  Has history of colon polyps as below.  Has no GI complaints.  Had cardiac cath with stent placed in 01/2021.  Was placed on Plavix at that time.  Colonoscopy 10/2018:  - Two 4 to 10 mm polyps in the cecum and at the ileocecal valve, removed with a cold snare. Resected and retrieved. - Two 1 to 2 mm polyps in the rectum, removed with a cold biopsy forceps. Resected and retrieved. - Diverticulosis in the sigmoid colon.  1. Surgical [P], cecum and ileocecal valve, polyp (2) - TUBULAR ADENOMA(S) - NEGATIVE FOR HIGH-GRADE DYSPLASIA OR MALIGNANCY 2. Surgical [P], colon, rectum, polyp (2) - HYPERPLASTIC POLYP(S)   Past Medical History:  Diagnosis Date   Allergic rhinitis, cause unspecified    Pollen   Allergy    Anxiety    Asthma    Dyslipidemia (high LDL; low HDL) 02/15/2021   H/O cold sores    HTN (hypertension) 02/15/2021   Hypertension    Internal hemorrhoids without mention of complication    Prediabetes 01/2011   Past Surgical History:  Procedure Laterality Date   COLONOSCOPY  11/12/2017   CORONARY STENT INTERVENTION N/A 02/14/2021   Procedure: CORONARY STENT INTERVENTION;  Surgeon: Martinique, Peter M, MD;  Location: [facility];  Service: Cardiovascular;  Laterality: N/A;   LEFT HEART CATH AND CORONARY ANGIOGRAPHY N/A 02/14/2021   Procedure: LEFT HEART CATH AND CORONARY ANGIOGRAPHY;  Surgeon: Martinique, Peter M, MD;  Location: [facility];  Service: Cardiovascular;  Laterality: N/A;   MUSCLE BIOPSY     POLYPECTOMY      reports that he quit smoking about 16 years ago. His smoking use included cigarettes. He has never used smokeless tobacco. He reports that he does not currently use drugs. He reports that he does not drink alcohol. family history includes Colon polyps in his  sister; Depression in his father; Diabetes in his brother and mother; Hypertension in his brother, father, and sister; Kidney disease in his mother. No Known Allergies    Outpatient Encounter Medications as of 10/10/2021  Medication Sig   aspirin EC 81 MG EC tablet Take 1 tablet (81 mg total) by mouth daily. Swallow whole.   carvedilol (COREG) 12.5 MG tablet TAKE ONE TABLET BY MOUTH TWICE A DAY WITH MEALS   cetirizine (ZYRTEC) 10 MG tablet Take 10 mg by mouth at bedtime.     CINNAMON PO Take 1 capsule by mouth daily.   clopidogrel (PLAVIX) 75 MG tablet Take 1 tablet (75 mg total) by mouth daily.   famciclovir (FAMVIR) 250 MG tablet Take 250 mg by mouth 2 (two) times daily.   Fluticasone-Salmeterol (ADVAIR) 250-50 MCG/DOSE AEPB Inhale 1 puff into the lungs every 12 (twelve) hours.     GARLIC PO Take 1 capsule by mouth daily.   ibuprofen (ADVIL) 200 MG tablet Take 400 mg by mouth every 6 (six) hours as needed for headache or moderate pain.   losartan (COZAAR) 25 MG tablet TAKE ONE TABLET BY MOUTH ONE TIME DAILY   Multiple Vitamins-Minerals (MULTIVITAL) tablet Take 1 tablet by mouth daily.     nitroGLYCERIN (NITROSTAT) 0.4 MG SL tablet Place 1 tablet (0.4 mg total) under the tongue every 5 (five) minutes x 3 doses as needed for chest pain.   OVER THE  COUNTER MEDICATION Apply 1 application topically at bedtime as needed (ankle pain). Hempvana max pain relief   Polyethyl Glycol-Propyl Glycol (SYSTANE) 0.4-0.3 % GEL ophthalmic gel Place 1 application into both eyes at bedtime.   rosuvastatin (CRESTOR) 40 MG tablet TAKE ONE TABLET BY MOUTH ONE TIME DAILY   Zinc 50 MG TABS Take 50 mg by mouth daily.   No facility-administered encounter medications on file as of 10/10/2021.    REVIEW OF SYSTEMS  : All other systems reviewed and negative except where noted in the History of Present Illness.   PHYSICAL EXAM: BP 124/70   Pulse 90   Ht '5\' 8"'$  (1.727 m)   Wt 195 lb (88.5 kg)   SpO2 96%   BMI 29.65  kg/m  General: Well developed white male in no acute distress  ASSESSMENT AND PLAN: Personal history of colon polyps:  Patient is here to discuss colonoscopy.  He is due for recall colonoscopy, 3-year recall due to history of colon polyps in September 2020.  Upon review of his chart he had a cardiac catheterization with stent placed in 01/2021 and was placed on Plavix at that time.  I discussed with the patient that it would be safest to wait until after being on the Plavix for a year before we try to hold that for 5 days to safely perform colonoscopy with polyp removal if that were to be needed.  He is in agreement.  He will see cardiology again in the fall to determine whether he will stay on the Plavix or if it will be discontinued altogether.  I told him to return to our office in early 2024 to rediscuss procedure at that time.  He has no GI complaints currently.  CC:  Merrilee Seashore, MD

## 2021-10-24 ENCOUNTER — Telehealth: Payer: Self-pay | Admitting: Cardiovascular Disease

## 2021-10-24 NOTE — Telephone Encounter (Signed)
Spoke at length with patient and his bill. He has my direct contact information to discuss more and is aware that our [facility] billing team is working on it. I let him know I will be in contact with him by the end of next week with an update. Patient appreciative of call, asked that I leave a detailed message on his VM if I do not get reach him.

## 2021-10-24 NOTE — Telephone Encounter (Signed)
Patient made aware that claim has been resubmitted with the appropriate clinical information to support the medical necessity denial and we are waiting for reimbursement from the insurance company.  Usually, when this happens it takes a few weeks with back-and-forth communication, but [facility] has a team dedicated to these issues.  Made patient aware that I will follow back up in 2 weeks to see status of reimbursement and claim. Patient agreed with plan, no additional questions at this time.

## 2021-10-24 NOTE — Telephone Encounter (Signed)
Pt called asking to speak to Dr. Gwenlyn Found about something.

## 2021-10-24 NOTE — Telephone Encounter (Signed)
Returned call to patient who states that he is still having issues with getting his Echocardiogram covered under his insurance. Patient states that he was told by his insurance that he needs a Peer to Peer. Patient wants to know if this is something that Dr. Gwenlyn Found can do so that this can be covered. Patient very upset on the phone regarding 1700$ bill that he states he is stuck having to pay. Patient states that he was told by his insurance that the Echo was not pre authorized before being scheduled. Patient would like message sent to nursing supervisor, and Dr. Gwenlyn Found in regards to this. Advised patient I would forward message. Patient verbalized understanding.

## 2021-11-07 ENCOUNTER — Telehealth: Payer: Self-pay | Admitting: Cardiovascular Disease

## 2021-11-07 NOTE — Telephone Encounter (Signed)
Patient would like to know if he needs to have labs drawn before his 6 month f/u visit on 12/12/21.  Patient said to leave him a message on his VM is he is not available.

## 2021-11-07 NOTE — Telephone Encounter (Signed)
Would you like this pt to have labs drawn before the upcoming appt?

## 2021-11-10 ENCOUNTER — Other Ambulatory Visit: Payer: Self-pay

## 2021-11-10 DIAGNOSIS — E785 Hyperlipidemia, unspecified: Secondary | ICD-10-CM

## 2021-11-10 DIAGNOSIS — Z23 Encounter for immunization: Secondary | ICD-10-CM | POA: Diagnosis not present

## 2021-11-10 DIAGNOSIS — Z79899 Other long term (current) drug therapy: Secondary | ICD-10-CM

## 2021-11-10 NOTE — Progress Notes (Signed)
Lab orders placed per Dr. Gwenlyn Found.

## 2021-11-10 NOTE — Telephone Encounter (Signed)
Called pt, no answer. Left detailed message for pt to have labs drawn before his appt.

## 2021-12-05 ENCOUNTER — Other Ambulatory Visit: Payer: Self-pay

## 2021-12-05 DIAGNOSIS — E785 Hyperlipidemia, unspecified: Secondary | ICD-10-CM | POA: Diagnosis not present

## 2021-12-05 DIAGNOSIS — Z79899 Other long term (current) drug therapy: Secondary | ICD-10-CM | POA: Diagnosis not present

## 2021-12-05 LAB — HEPATIC FUNCTION PANEL
ALT: 60 IU/L — ABNORMAL HIGH (ref 0–44)
AST: 49 IU/L — ABNORMAL HIGH (ref 0–40)
Albumin: 4.6 g/dL (ref 3.8–4.9)
Alkaline Phosphatase: 55 IU/L (ref 44–121)
Bilirubin Total: 0.5 mg/dL (ref 0.0–1.2)
Bilirubin, Direct: 0.17 mg/dL (ref 0.00–0.40)
Total Protein: 7 g/dL (ref 6.0–8.5)

## 2021-12-05 LAB — LIPID PANEL
Chol/HDL Ratio: 2.5 ratio (ref 0.0–5.0)
Cholesterol, Total: 93 mg/dL — ABNORMAL LOW (ref 100–199)
HDL: 37 mg/dL — ABNORMAL LOW (ref >39)
LDL Chol Calc (NIH): 43 mg/dL (ref 0–99)
Triglycerides: 56 mg/dL (ref 0–149)
VLDL Cholesterol Cal: 13 mg/dL (ref 5–40)

## 2021-12-11 NOTE — Progress Notes (Signed)
Cardiology Clinic Note   Patient Name: Mike Schmidt Date of Encounter: 12/12/2021  Primary Care Provider:  Merrilee Seashore, MD Primary Cardiologist:  Quay Burow, MD  Patient Profile    56 year old male with history of CAD, after having NSTEMI December 2022. LHC revealed high grade mid AV groove Cx stenosis, requiring stenting with a Medtronic Frontier 3.5 X 26 mm DES. He had no other evidence of obstructive CAD. Treated with ASA and Brilinta. Other diagnoses include HTN, HL, and Type II Diabetes. Echo on 05/30/2021 revaled EF of 55%, mild LVH with mild calcification of AoV. He was last seen by Dr. Gwenlyn Found on 06/13/2021 and was doing well.   Past Medical History    Past Medical History:  Diagnosis Date   Allergic rhinitis, cause unspecified    Pollen   Allergy    Anxiety    Asthma    Dyslipidemia (high LDL; low HDL) 02/15/2021   H/O cold sores    HTN (hypertension) 02/15/2021   Hypertension    Internal hemorrhoids without mention of complication    Prediabetes 01/2011   Past Surgical History:  Procedure Laterality Date   COLONOSCOPY  11/12/2017   CORONARY STENT INTERVENTION N/A 02/14/2021   Procedure: CORONARY STENT INTERVENTION;  Surgeon: Martinique, Peter M, MD;  Location: [facility];  Service: Cardiovascular;  Laterality: N/A;   LEFT HEART CATH AND CORONARY ANGIOGRAPHY N/A 02/14/2021   Procedure: LEFT HEART CATH AND CORONARY ANGIOGRAPHY;  Surgeon: Martinique, Peter M, MD;  Location: [facility];  Service: Cardiovascular;  Laterality: N/A;   MUSCLE BIOPSY     POLYPECTOMY      Allergies  No Known Allergies  History of Present Illness    [name] comes today for ongoing assessment and management of CAD with hx of DES to the Cx December of 2022, HTN, and HL.   He comes today without any cardiac complaints.  He is completely changed his diet to [place], he is walking at lunch every day, has become more active, and has been compliant with all  medication regimen.  He has lost weight and feels much better.  Home Medications    Current Outpatient Medications  Medication Sig Dispense Refill   aspirin EC 81 MG EC tablet Take 1 tablet (81 mg total) by mouth daily. Swallow whole. 30 tablet 11   carvedilol (COREG) 12.5 MG tablet TAKE ONE TABLET BY MOUTH TWICE A DAY WITH MEALS 60 tablet 11   cetirizine (ZYRTEC) 10 MG tablet Take 10 mg by mouth at bedtime.       CINNAMON PO Take 1 capsule by mouth daily.     clopidogrel (PLAVIX) 75 MG tablet Take 1 tablet (75 mg total) by mouth daily. 90 tablet 3   famciclovir (FAMVIR) 250 MG tablet Take 250 mg by mouth 2 (two) times daily.  8   Fluticasone-Salmeterol (ADVAIR) 250-50 MCG/DOSE AEPB Inhale 1 puff into the lungs every 12 (twelve) hours.       GARLIC PO Take 1 capsule by mouth daily.     ibuprofen (ADVIL) 200 MG tablet Take 400 mg by mouth every 6 (six) hours as needed for headache or moderate pain.     losartan (COZAAR) 25 MG tablet TAKE ONE TABLET BY MOUTH ONE TIME DAILY 30 tablet 11   Multiple Vitamins-Minerals (MULTIVITAL) tablet Take 1 tablet by mouth daily.       nitroGLYCERIN (NITROSTAT) 0.4 MG SL tablet Place 1 tablet (0.4 mg total) under the  tongue every 5 (five) minutes x 3 doses as needed for chest pain. 25 tablet 12   OVER THE COUNTER MEDICATION Apply 1 application topically at bedtime as needed (ankle pain). Hempvana max pain relief     Polyethyl Glycol-Propyl Glycol (SYSTANE) 0.4-0.3 % GEL ophthalmic gel Place 1 application into both eyes at bedtime.     rosuvastatin (CRESTOR) 40 MG tablet TAKE ONE TABLET BY MOUTH ONE TIME DAILY 30 tablet 11   Zinc 50 MG TABS Take 50 mg by mouth daily.     No current facility-administered medications for this visit.     Family History    Family History  Problem Relation Age of Onset   Diabetes Mother        T2DM   Kidney disease Mother    Hypertension Father    Depression Father    Hypertension Sister    Colon polyps Sister     Hypertension Brother    Diabetes Brother        T2DM   Colon cancer Neg Hx    Esophageal cancer Neg Hx    Rectal cancer Neg Hx    Stomach cancer Neg Hx    He indicated that his mother is deceased. He indicated that his father is deceased. He indicated that his sister is alive. He indicated that his brother is deceased. He indicated that the status of his neg hx is unknown.  Social History    Social History   Socioeconomic History   Marital status: Married    Spouse name: Not on file   Number of children: 1   Years of education: Not on file   Highest education level: Not on file  Occupational History   Occupation: Art gallery manager  Tobacco Use   Smoking status: Former    Types: Cigarettes    Quit date: 2007    Years since quitting: 16.8   Smokeless tobacco: Never  Vaping Use   Vaping Use: Never used  Substance and Sexual Activity   Alcohol use: No   Drug use: Not Currently   Sexual activity: Not on file  Other Topics Concern   Not on file  Social History Narrative   Not on file   Social Determinants of Health   Financial Resource Strain: Not on file  Food Insecurity: Not on file  Transportation Needs: Not on file  Physical Activity: Not on file  Stress: Not on file  Social Connections: Not on file  Intimate Partner Violence: Not on file     Review of Systems    General:  No chills, fever, night sweats or weight changes.  Cardiovascular:  No chest pain, dyspnea on exertion, edema, orthopnea, palpitations, paroxysmal nocturnal dyspnea. Dermatological: No rash, lesions/masses Respiratory: No cough, dyspnea Urologic: No hematuria, dysuria Abdominal:   No nausea, vomiting, diarrhea, bright red blood per rectum, melena, or hematemesis Neurologic:  No visual changes, wkns, changes in mental status. All other systems reviewed and are otherwise negative except as noted above.     Physical Exam    VS:  BP 120/68 (BP Location: Left Arm, Patient Position: Sitting,  Cuff Size: Normal)   Pulse 60   Ht '5\' 8"'$  (1.727 m)   Wt 193 lb 6.4 oz (87.7 kg)   SpO2 97%   BMI 29.41 kg/m  , BMI Body mass index is 29.41 kg/m.     GEN: Well nourished, well developed, in no acute distress. HEENT: normal. Neck: Supple, no JVD, carotid bruits, or masses. Cardiac: RRR,  no murmurs, rubs, or gallops. No clubbing, cyanosis, edema.  Radials/DP/PT 2+ and equal bilaterally.  Respiratory:  Respirations regular and unlabored, clear to auscultation bilaterally. GI: Soft, nontender, nondistended, BS + x 4. MS: no deformity or atrophy. Skin: warm and dry, no rash. Neuro:  Strength and sensation are intact. Psych: Normal affect.  Accessory Clinical Findings    ECG personally reviewed by me today-normal sinus rhythm with sinus arrhythmia, PAC, heart rate of 60 bpm.  T wave flattening V6 and lead II.- No acute changes  Lab Results  Component Value Date   WBC 7.9 02/15/2021   HGB 16.5 02/15/2021   HCT 48.9 02/15/2021   MCV 87.9 02/15/2021   PLT 235 02/15/2021   Lab Results  Component Value Date   CREATININE 1.02 02/15/2021   BUN 14 02/15/2021   NA 135 02/15/2021   K 4.0 02/15/2021   CL 99 02/15/2021   CO2 27 02/15/2021   Lab Results  Component Value Date   ALT 60 (H) 12/05/2021   AST 49 (H) 12/05/2021   ALKPHOS 55 12/05/2021   BILITOT 0.5 12/05/2021   Lab Results  Component Value Date   CHOL 93 (L) 12/05/2021   HDL 37 (L) 12/05/2021   LDLCALC 43 12/05/2021   TRIG 56 12/05/2021   CHOLHDL 2.5 12/05/2021    Lab Results  Component Value Date   HGBA1C 6.8 (H) 02/14/2021    Review of Prior Studies: Echocardiogram  1. Left ventricular ejection fraction, by estimation, is 55%. The left  ventricle has normal function. The left ventricle demonstrates regional  wall motion abnormalities (see scoring diagram/findings for description).  There is mild concentric left  ventricular hypertrophy. Left ventricular diastolic parameters are  consistent with Grade II  diastolic dysfunction (pseudonormalization).  There is hypokinesis of the left ventricular, basal-mid lateral wall.   2. Right ventricular systolic function is normal. The right ventricular  size is normal.   3. Left atrial size was mildly dilated.   4. Right atrial size was mildly dilated.   5. The mitral valve is normal in structure. Trivial mitral valve  regurgitation. No evidence of mitral stenosis.   6. The aortic valve is tricuspid. There is mild calcification of the  aortic valve. Aortic valve regurgitation is not visualized. Aortic valve  sclerosis/calcification is present, without any evidence of aortic  stenosis.   7. The inferior vena cava is normal in size with greater than 50%  respiratory variability, suggesting right atrial pressure of 3 mmHg.    LHC 02/14/2021 1st Diag lesion is 40% stenosed.   Mid Cx lesion is 85% stenosed.   A drug-eluting stent was successfully placed using a STENT ONYX FRONTIER 3.5X26.   Post intervention, there is a 0% residual stenosis.   The left ventricular systolic function is normal.   LV end diastolic pressure is normal.   The left ventricular ejection fraction is 55-65% by visual estimate.   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 Risk factor modification. Anticipate DC in am if stable.   Assessment & Plan   1.  Coronary artery disease: History of NSTEMI December 2022, with high-grade mid AV groove circumflex stenosis, status post DES.  We will stop Plavix in December 2023 as it has been 1 year.  He will continue on aspirin 81 mg daily.  He  offers no cardiac complaints.  He has completely changed his diet and lifestyle feels much better has lost weight and has gained back energy.  He is congratulated on these lifestyle changes and encouraged to continue.  2.  Hypercholesterolemia: I have  reviewed his most recent lab work dated 12/05/2021, excellent control of cholesterol with total cholesterol 93, HDL 37, LDL 56.  As a result of this very low level of cholesterol I will decrease his Crestor from 40 mg daily to 20 mg daily.  He will have follow-up labs prior to next appointment in 6 months.  3.  Hypertension: Blood pressure is very well controlled.  He is encouraged to continue exercising, weight loss, and heart healthy diet.  I have advised him if he continues to lose weight we may have to adjust his antihypertensive medications to provide protection against hypotension.  He verbalizes understanding.    Current medicines are reviewed at length with the patient today.  I have spent 25 min's  dedicated to the care of this patient on the date of this encounter to include pre-visit review of records, assessment, management and diagnostic testing,with shared decision making. Signed, Phill Myron. West Pugh, ANP, AACC   12/12/2021 1:39 PM      Office [phone] Fax [phone]  Notice: This dictation was prepared with Dragon dictation along with smaller phrase technology. Any transcriptional errors that result from this process are unintentional and may not be corrected upon review.

## 2021-12-12 ENCOUNTER — Ambulatory Visit: Payer: BC Managed Care – PPO | Attending: Adult Health | Admitting: Adult Health

## 2021-12-12 ENCOUNTER — Encounter: Payer: Self-pay | Admitting: Adult Health

## 2021-12-12 VITALS — BP 120/68 | HR 60 | Ht 68.0 in | Wt 193.4 lb

## 2021-12-12 DIAGNOSIS — E785 Hyperlipidemia, unspecified: Secondary | ICD-10-CM

## 2021-12-12 DIAGNOSIS — I2510 Atherosclerotic heart disease of native coronary artery without angina pectoris: Secondary | ICD-10-CM | POA: Diagnosis not present

## 2021-12-12 DIAGNOSIS — R7303 Prediabetes: Secondary | ICD-10-CM | POA: Diagnosis not present

## 2021-12-12 DIAGNOSIS — I10 Essential (primary) hypertension: Secondary | ICD-10-CM | POA: Diagnosis not present

## 2021-12-12 DIAGNOSIS — R748 Abnormal levels of other serum enzymes: Secondary | ICD-10-CM | POA: Diagnosis not present

## 2021-12-12 NOTE — Patient Instructions (Addendum)
Medication Instructions:  Decrease Crestor from 40 mg to 20 mg Daily. Stop Plavix in December. *If you need a refill on your cardiac medications before your next appointment, please call your pharmacy*   Lab Work: Hepatic Panel , Lipid Panel . To Be Done in 6 months If you have labs (blood work) drawn today and your tests are completely normal, you will receive your results only by: [place] (if you have MyChart) OR A paper copy in the mail If you have any lab test that is abnormal or we need to change your treatment, we will call you to review the results.   Testing/Procedures: No Testing   Follow-Up: At [facility], you and your health needs are our priority.  As part of our continuing mission to provide you with exceptional heart care, we have created designated Provider Care Teams.  These Care Teams include your primary Cardiologist (physician) and Advanced Practice Providers (APPs -  Physician Assistants and Nurse Practitioners) who all work together to provide you with the care you need, when you need it.  We recommend signing up for the patient portal called "MyChart".  Sign up information is provided on this After Visit Summary.  MyChart is used to connect with patients for Virtual Visits (Telemedicine).  Patients are able to view lab/test results, encounter notes, upcoming appointments, etc.  Non-urgent messages can be sent to your provider as well.   To learn more about what you can do with MyChart, go to [email or web address].    Your next appointment:   6 month(s)  The format for your next appointment:   In Person  Provider:   Jory Sims, DNP, ANP  or, Quay Burow, MD

## 2022-03-03 ENCOUNTER — Telehealth: Payer: Self-pay | Admitting: Gastroenterology

## 2022-03-03 NOTE — Telephone Encounter (Signed)
Left message on machine to call back  

## 2022-03-03 NOTE — Telephone Encounter (Signed)
Patient wife is calling wanting to schedule a colonoscopy for him, states he is off his medication and he was told to call back. Informed her of last office notes that he is to return to the office to rediscuss the procedure so I would not be able to schedule him directly. She doesn't think that's true and would like for a nurse to call the patient. Please advise.

## 2022-03-04 NOTE — Telephone Encounter (Signed)
The pt has been advised the he will need to be seen in clinic to discuss colon per last office note.  He has been scheduled to see Dr Silverio Decamp on 3/26 at 1:50 pm.  He will call if he has any further questions or concerns

## 2022-03-27 DIAGNOSIS — E782 Mixed hyperlipidemia: Secondary | ICD-10-CM | POA: Diagnosis not present

## 2022-03-27 DIAGNOSIS — R7303 Prediabetes: Secondary | ICD-10-CM | POA: Diagnosis not present

## 2022-03-27 DIAGNOSIS — I2510 Atherosclerotic heart disease of native coronary artery without angina pectoris: Secondary | ICD-10-CM | POA: Diagnosis not present

## 2022-05-13 IMAGING — CT CT ANGIO CHEST-ABD-PELV FOR DISSECTION W/ AND WO/W CM
2 of 7 series · 14 of 46 positions shown, 16 images · IV contrast (APPLIED)
Comparison: None.

CLINICAL DATA: Chest pain or back pain, aortic dissection suspected

EXAM:
CT ANGIOGRAPHY CHEST, ABDOMEN AND PELVIS
TECHNIQUE: Non-contrast CT of the chest was initially obtained.

[Series 6: arterial · axial · arterial · 0.84mm/px · z∈[+711,+1279]mm · 11 of 318 slices shown, 13 images]
[im 17/318  soft-tissue]
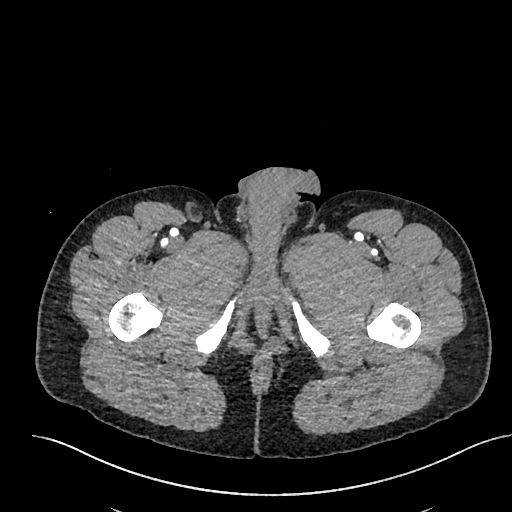
[im 17/318  bone]
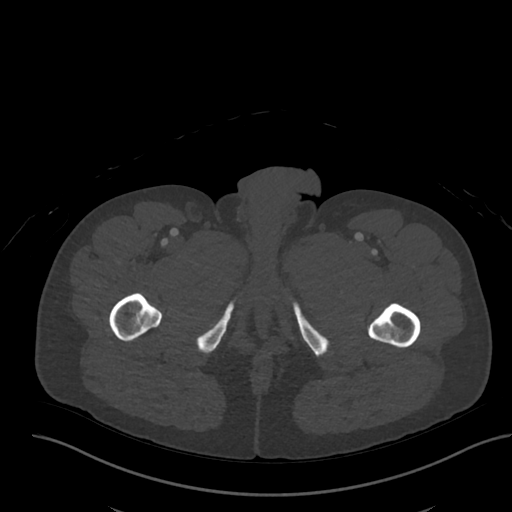
[im 51/318  soft-tissue]
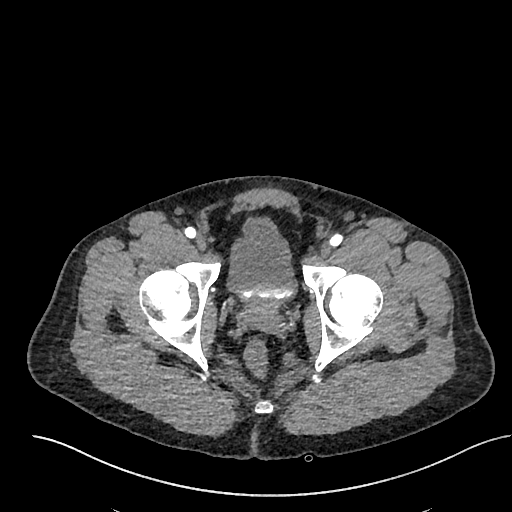
[im 84/318  soft-tissue]
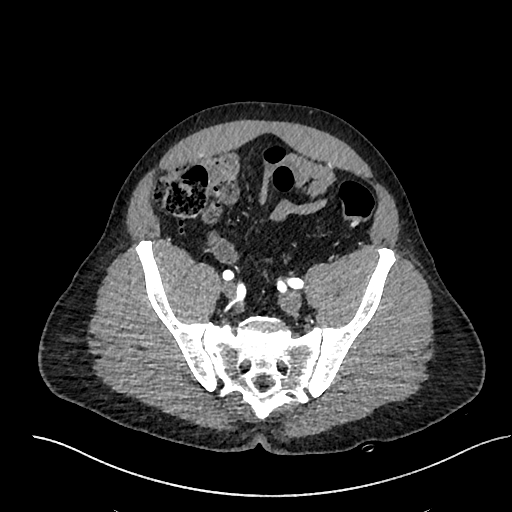
[im 101/318  soft-tissue]
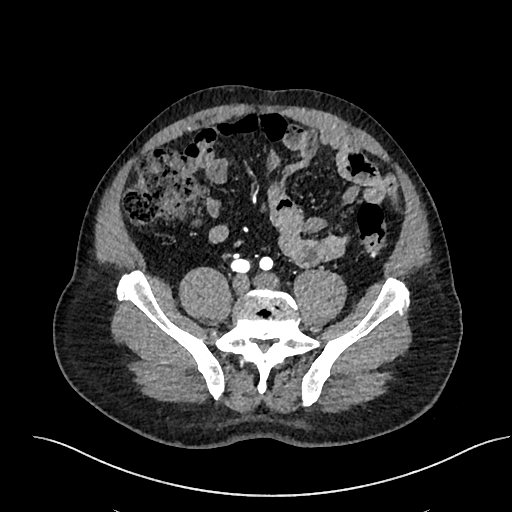
[im 134/318  soft-tissue]
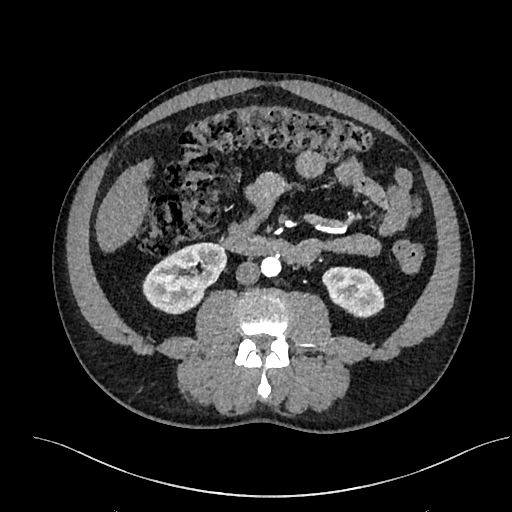
[im 167/318  soft-tissue]
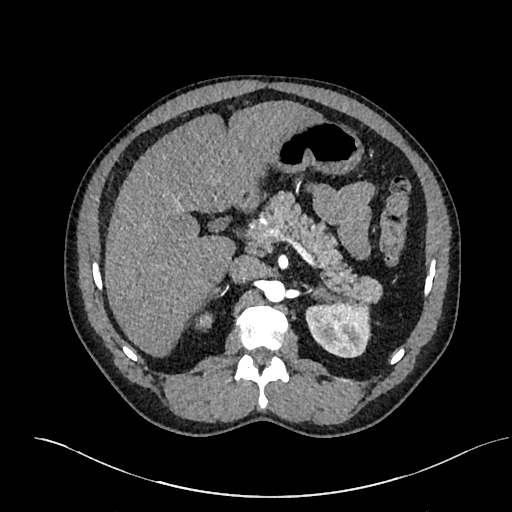
[im 184/318  soft-tissue]
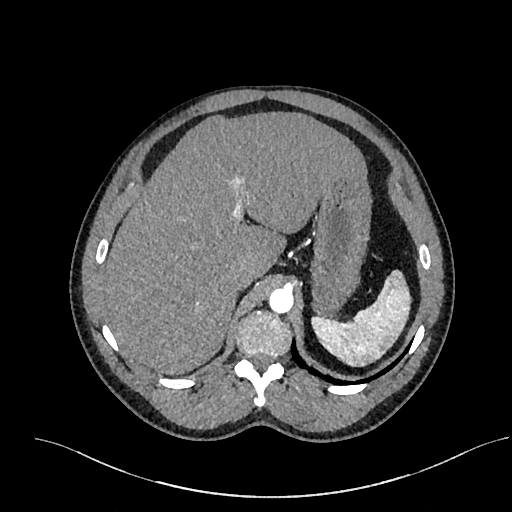
[im 217/318  soft-tissue]
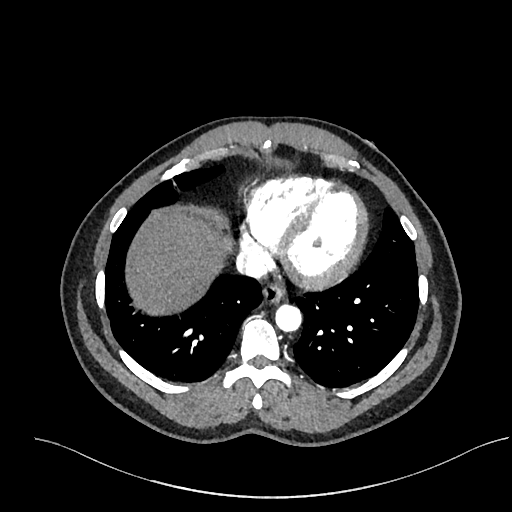
[im 234/318  soft-tissue]
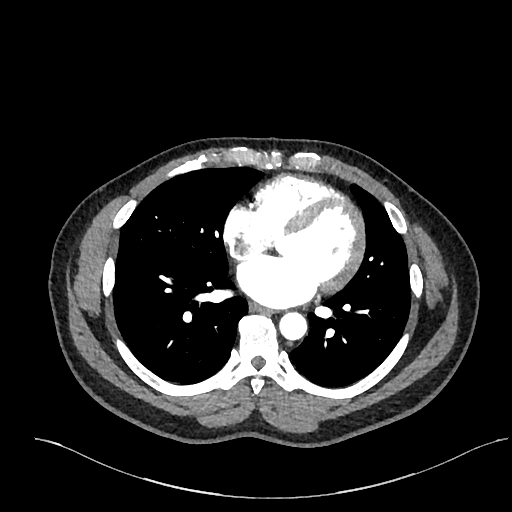
[im 234/318  bone]
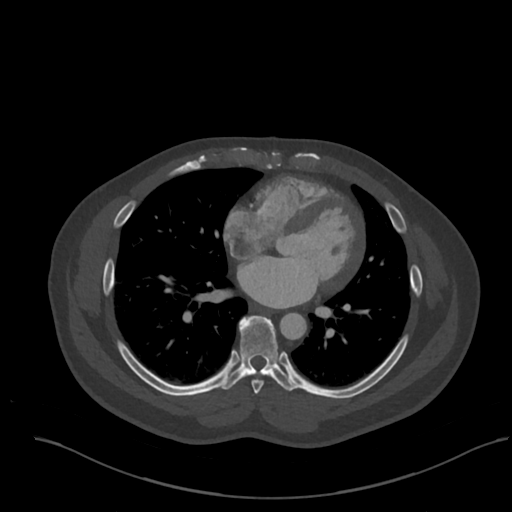
[im 267/318  soft-tissue]
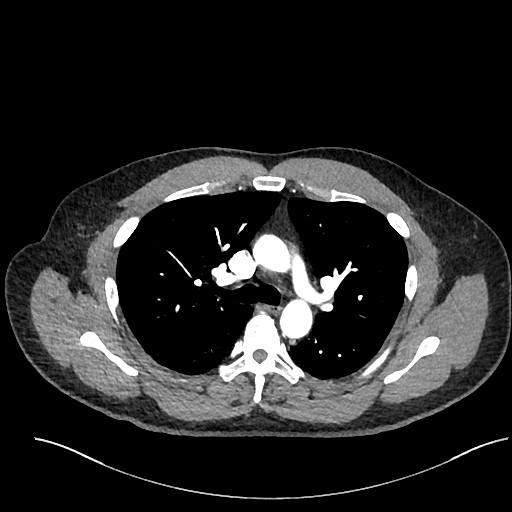
[im 301/318  soft-tissue]
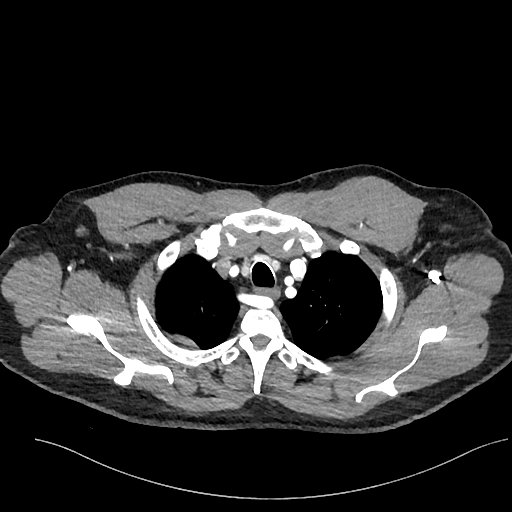

[Series 9: cor · coronal · 0.84mm/px · 3 of 151 slices shown]
[im 38/151  soft-tissue]
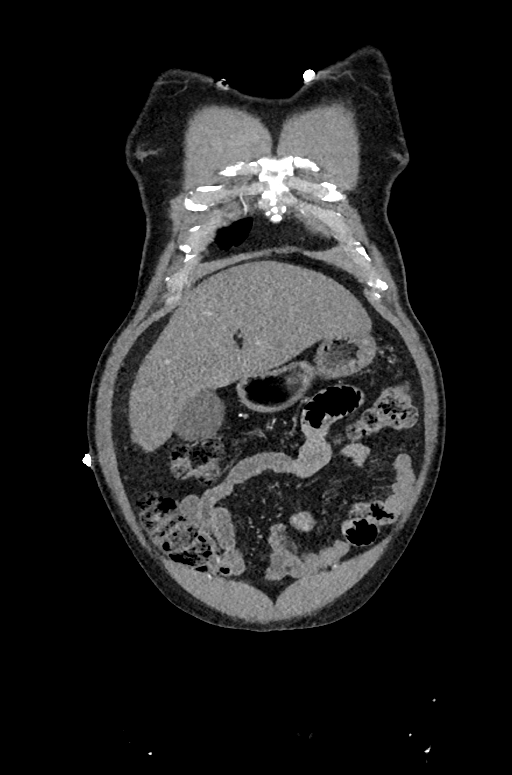
[im 76/151  soft-tissue]
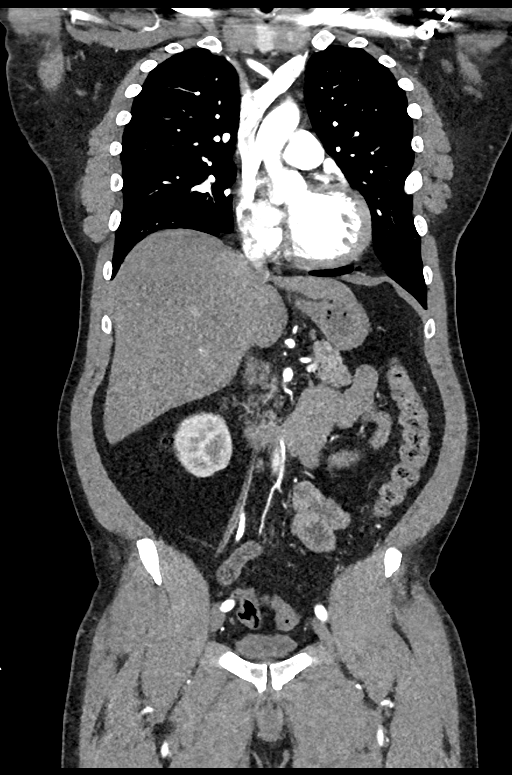
[im 113/151  soft-tissue]
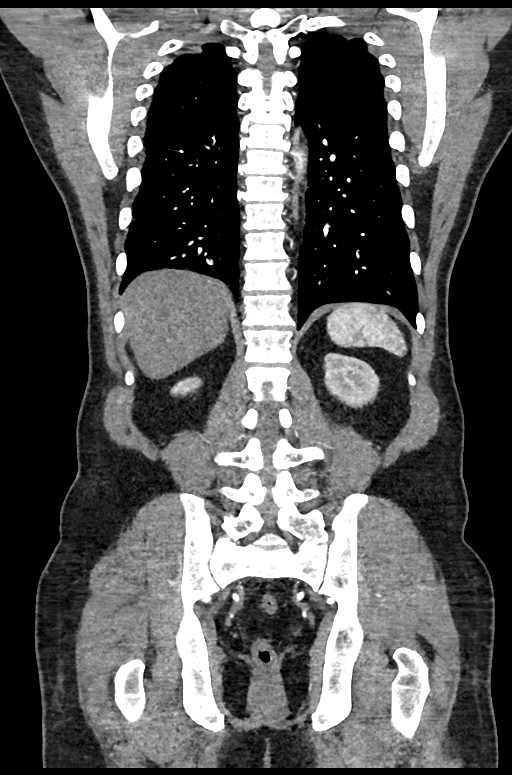

[14 of 46 positions shown; findings below may reference images not displayed]

Multidetector CT imaging through the chest, abdomen and pelvis was
performed using the standard protocol during bolus administration of
intravenous contrast. Multiplanar reconstructed images and MIPs were
obtained and reviewed to evaluate the vascular anatomy.

CONTRAST:  100mL OMNIPAQUE IOHEXOL 350 MG/ML SOLN
FINDINGS: CTA CHEST FINDINGS

Cardiovascular: No significant coronary artery calcification. Global
cardiac size within normal limits. No pericardial effusion. Central
pulmonary arteries are of normal caliber. There is adequate
opacification of the pulmonary arterial tree and no intraluminal
filling defect is seen through the segmental level to suggest acute
pulmonary embolism. The thoracic aorta is of normal caliber. No
intramural hematoma or dissection. Aberrant right subclavian artery
noted. The arch vasculature is widely patent proximally. No
significant atherosclerotic calcification.

Mediastinum/Nodes: No enlarged mediastinal, hilar, or axillary lymph
nodes. Thyroid gland, trachea, and esophagus demonstrate no
significant findings.

Lungs/Pleura: Mild right basilar atelectasis. Lungs are otherwise
clear. No pneumothorax or pleural effusion. Central airways are
widely patent.

Musculoskeletal: Circumscribed 11 mm mixed lytic and sclerotic
lesion demonstrating whorled central calcification within the left
seventh rib anterolaterally is most in keeping with an enchondroma.
No acute bone abnormality.

Review of the MIP images confirms the above findings.

CTA ABDOMEN AND PELVIS FINDINGS

VASCULAR

Aorta: Normal caliber aorta without aneurysm, dissection, vasculitis
or significant stenosis.

Celiac: Patent without evidence of aneurysm, dissection, vasculitis
or significant stenosis.

SMA: Patent without evidence of aneurysm, dissection, vasculitis or
significant stenosis.

Renals: Both renal arteries are patent without evidence of aneurysm,
dissection, vasculitis, fibromuscular dysplasia or significant
stenosis.

IMA: Patent without evidence of aneurysm, dissection, vasculitis or
significant stenosis.

Inflow: Patent without evidence of aneurysm, dissection, vasculitis
or significant stenosis.

Veins: No obvious venous abnormality within the limitations of this
arterial phase study.

Review of the MIP images confirms the above findings.

NON-VASCULAR

Hepatobiliary: No focal liver abnormality is seen. No gallstones,
gallbladder wall thickening, or biliary dilatation.

Pancreas: Unremarkable

Spleen: Unremarkable

Adrenals/Urinary Tract: Adrenal glands are unremarkable. Kidneys are
normal, without renal calculi, focal lesion, or hydronephrosis.
Bladder is unremarkable.

Stomach/Bowel: Mild descending and sigmoid colonic diverticulosis
without superimposed acute inflammatory change. The stomach, small
bowel, and large bowel are otherwise unremarkable. Appendix normal.
No free intraperitoneal gas.

Lymphatic: No pathologic adenopathy within the abdomen and pelvis.

Reproductive: Prostate is unremarkable.

Other: No abdominal wall hernia or abnormality. No abdominopelvic
ascites.

Musculoskeletal: No acute bone abnormality. Degenerative changes are
seen at the lumbosacral junction.

Review of the MIP images confirms the above findings.
IMPRESSION: No evidence of thoracoabdominal aortic aneurysm or dissection. No
acute intrathoracic or intra-abdominal pathology identified. No
definite radiographic explanation for the patient's reported
symptoms. Incidental findings as noted above.

## 2022-05-19 ENCOUNTER — Ambulatory Visit: Payer: BC Managed Care – PPO | Admitting: Gastroenterology

## 2022-06-26 ENCOUNTER — Ambulatory Visit (INDEPENDENT_AMBULATORY_CARE_PROVIDER_SITE_OTHER): Payer: BC Managed Care – PPO | Admitting: Nurse Practitioner

## 2022-06-26 ENCOUNTER — Encounter: Payer: Self-pay | Admitting: Nurse Practitioner

## 2022-06-26 ENCOUNTER — Other Ambulatory Visit: Payer: Self-pay | Admitting: Physician Assistant

## 2022-06-26 VITALS — BP 136/68 | HR 82 | Ht 68.0 in | Wt 191.0 lb

## 2022-06-26 DIAGNOSIS — Z86010 Personal history of colonic polyps: Secondary | ICD-10-CM

## 2022-06-26 MED ORDER — NA SULFATE-K SULFATE-MG SULF 17.5-3.13-1.6 GM/177ML PO SOLN: 1.0000 | ORAL | 0 refills | Status: DC

## 2022-06-26 NOTE — Patient Instructions (Signed)
_______________________________________________________  If your blood pressure at your visit was 140/90 or greater, please contact your primary care physician to follow up on this. _______________________________________________________  If you are age 55 or younger, your body mass index should be between 19-25. Your Body mass index is 29.04 kg/m. If this is out of the aformentioned range listed, please consider follow up with your Primary Care Provider.  ________________________________________________________  The [place] [facility] providers would like to encourage you to use [facility] to communicate with providers for non-urgent requests or questions.  Due to long hold times on the telephone, sending your provider a message by [facility] may be a faster and more efficient way to get a response.  Please allow 48 business hours for a response.  Please remember that this is for non-urgent requests.  _______________________________________________________  Mike Schmidt have been scheduled for a colonoscopy. Please follow written instructions given to you at your visit today.  Please pick up your prep supplies at the pharmacy within the next 1-3 days. If you use inhalers (even only as needed), please bring them with you on the day of your procedure.  Due to recent changes in healthcare laws, you may see the results of your imaging and laboratory studies on MyChart before your provider has had a chance to review them.  We understand that in some cases there may be results that are confusing or concerning to you. Not all laboratory results come back in the same time frame and the provider may be waiting for multiple results in order to interpret others.  Please give [place] 48 hours in order for your provider to thoroughly review all the results before contacting the office for clarification of your results.   Thank you for entrusting me with your care and choosing [facility]  Willette Cluster, NP

## 2022-06-26 NOTE — Progress Notes (Signed)
Assessment and Plan   Primary Gi: Marsa Aris, MD  Brief Narrative:  57 y.o. yo male with a past medical history consisting of, but not limited to CAD / MI s/p stent placement Dec 2022, adenomatous colon polyps  CAD / MI s/p stent placement Dec 2022 . Cardiology stopped Plavix in January 2023.   History of adenomatous colon polyps.  Two tubular adenomas ranging from 4 to 10 mm removed from the cecum and ileocecal valve in September 2020.  A 3-year surveillance colonoscopy was recommended but subsequently put on hold due to need for Plavix  -Cardiology discontinued Plavix in January 2024 -Schedule for a polyp surveillance colonoscopy. The risks and benefits of colonoscopy with possible polypectomy / biopsies were discussed and the patient agrees to proceed.   Elevated liver enzymes.  I noticed this after patient had left the clinic today.  His liver enzymes were mildly elevated between December 2022 and October 2023.  No labs in epic since October.  Perhaps PCP has repeated labs in his office since? . -Will defer to PCP.  If liver chemistries remain elevated then we are happy to evaluate  History of Present Illness   Chief complaint: History of polyps, due for colonoscopy  Mike Schmidt has returned to get scheduled for his colonoscopy.  Recall that he had an MI in December 2022 and underwent stent placement.  At that time he was started on Plavix so we were unable to proceed with his 3-year interval surveillance colonoscopy.  He has completed a year of Plavix and cardiology discontinued it in January of this year.  He feels well and is ready to proceed with colonoscopy.  No blood in stool. Having a daily formed stool. Appetite  Previous GI Endoscopies / Labs / Imaging   Colonoscopy 10/2018:   - Two 4 to 10 mm polyps in the cecum and at the ileocecal valve, removed with a cold snare. Resected and retrieved. - Two 1 to 2 mm polyps in the rectum, removed with a cold biopsy forceps.  Resected and retrieved. - Diverticulosis in the sigmoid colon.  1. Surgical [P], cecum and ileocecal valve, polyp (2) - TUBULAR ADENOMA(S) - NEGATIVE FOR HIGH-GRADE DYSPLASIA OR MALIGNANCY 2. Surgical [P], colon, rectum, polyp (2) - HYPERPLASTIC POLYP(S)     Latest Ref Rng & Units 12/05/2021   11:08 AM 06/06/2021   10:50 AM 02/13/2021   11:42 PM  Hepatic Function  Total Protein 6.0 - 8.5 g/dL 7.0  6.6  6.4   Albumin 3.8 - 4.9 g/dL 4.6  4.7  3.9   AST 0 - 40 IU/L 49  51  39   ALT 0 - 44 IU/L 60  72  69   Alk Phosphatase 44 - 121 IU/L 55  55  59   Total Bilirubin 0.0 - 1.2 mg/dL 0.5  0.6  0.4   Bilirubin, Direct 0.00 - 0.40 mg/dL 0.98  1.19       Past Medical History:  Diagnosis Date   Allergic rhinitis, cause unspecified    Pollen   Allergy    Anxiety    Asthma    Dyslipidemia (high LDL; low HDL) 02/15/2021   H/O cold sores    HTN (hypertension) 02/15/2021   Hypertension    Internal hemorrhoids without mention of complication    Prediabetes 01/2011    Past Surgical History:  Procedure Laterality Date   COLONOSCOPY  11/12/2017   CORONARY STENT INTERVENTION N/A 02/14/2021   Procedure: CORONARY STENT INTERVENTION;  Surgeon: Swaziland, Peter M, MD;  Location: [facility] INVASIVE CV LAB;  Service: Cardiovascular;  Laterality: N/A;   LEFT HEART CATH AND CORONARY ANGIOGRAPHY N/A 02/14/2021   Procedure: LEFT HEART CATH AND CORONARY ANGIOGRAPHY;  Surgeon: Swaziland, Peter M, MD;  Location: [facility] INVASIVE CV LAB;  Service: Cardiovascular;  Laterality: N/A;   MUSCLE BIOPSY     POLYPECTOMY      Current Medications, Allergies, Family History and Social History were reviewed in [employer] record.     Current Outpatient Medications  Medication Sig Dispense Refill   aspirin EC 81 MG EC tablet Take 1 tablet (81 mg total) by mouth daily. Swallow whole. 30 tablet 11   carvedilol (COREG) 12.5 MG tablet TAKE ONE TABLET BY MOUTH TWICE DAILY WITH A MEAL 180 tablet 1    cetirizine (ZYRTEC) 10 MG tablet Take 10 mg by mouth at bedtime.       CINNAMON PO Take 1 capsule by mouth daily.     famciclovir (FAMVIR) 250 MG tablet Take 250 mg by mouth 2 (two) times daily.  8   Fluticasone-Salmeterol (ADVAIR) 250-50 MCG/DOSE AEPB Inhale 1 puff into the lungs every 12 (twelve) hours.       GARLIC PO Take 1 capsule by mouth daily.     ibuprofen (ADVIL) 200 MG tablet Take 400 mg by mouth every 6 (six) hours as needed for headache or moderate pain.     losartan (COZAAR) 25 MG tablet TAKE ONE TABLET BY MOUTH ONE TIME DAILY 90 tablet 1   Multiple Vitamins-Minerals (MULTIVITAL) tablet Take 1 tablet by mouth daily.       Na Sulfate-K Sulfate-Mg Sulf (SUPREP BOWEL PREP KIT) 17.5-3.13-1.6 GM/177ML SOLN Take 1 kit by mouth as directed. 324 mL 0   nitroGLYCERIN (NITROSTAT) 0.4 MG SL tablet Place 1 tablet (0.4 mg total) under the tongue every 5 (five) minutes x 3 doses as needed for chest pain. 25 tablet 12   OVER THE COUNTER MEDICATION Apply 1 application topically at bedtime as needed (ankle pain). Hempvana max pain relief     Polyethyl Glycol-Propyl Glycol (SYSTANE) 0.4-0.3 % GEL ophthalmic gel Place 1 application into both eyes at bedtime.     rosuvastatin (CRESTOR) 20 MG tablet Take 20 mg by mouth daily. 1 Tablet Daily.     Zinc 50 MG TABS Take 50 mg by mouth daily.     No current facility-administered medications for this visit.    Review of Systems: No chest pain. No shortness of breath. No urinary complaints.    Physical Exam  Wt Readings from Last 3 Encounters:  06/26/22 191 lb (86.6 kg)  12/12/21 193 lb 6.4 oz (87.7 kg)  10/10/21 195 lb (88.5 kg)    BP 136/68   Pulse 82   Ht 5\' 8"  (1.727 m)   Wt 191 lb (86.6 kg)   BMI 29.04 kg/m  Constitutional:  Pleasant, generally well appearing male in no acute distress. Psychiatric: Normal mood and affect. Behavior is normal. EENT: Pupils normal.  Conjunctivae are normal. No scleral icterus. Neck supple.   Cardiovascular: Normal rate, regular rhythm.  Pulmonary/chest: Effort normal and breath sounds normal. No wheezing, rales or rhonchi. Abdominal: Soft, nondistended, nontender. Bowel sounds active throughout. There are no masses palpable. No hepatomegaly. Neurological: Alert and oriented to person place and time.  Skin: Skin is warm and dry. No rashes noted.  Willette Cluster, NP  06/26/2022, 2:25 PM

## 2022-07-02 ENCOUNTER — Encounter: Payer: Self-pay | Admitting: Gastroenterology

## 2022-07-02 ENCOUNTER — Encounter: Payer: Self-pay | Admitting: Certified Registered Nurse Anesthetist

## 2022-07-08 ENCOUNTER — Other Ambulatory Visit: Payer: Self-pay

## 2022-07-08 NOTE — Progress Notes (Signed)
Encounter opened in error

## 2022-07-08 NOTE — Progress Notes (Signed)
Reviewed and agree with documentation and assessment and plan. K. Veena Dallan Schonberg , MD   

## 2022-07-09 ENCOUNTER — Encounter: Payer: Self-pay | Admitting: Certified Registered Nurse Anesthetist

## 2022-07-10 ENCOUNTER — Ambulatory Visit (AMBULATORY_SURGERY_CENTER): Payer: BC Managed Care – PPO | Admitting: Gastroenterology

## 2022-07-10 ENCOUNTER — Encounter: Payer: Self-pay | Admitting: Gastroenterology

## 2022-07-10 VITALS — BP 131/99 | HR 64 | Temp 98.6°F | Resp 19 | Ht 68.0 in | Wt 191.0 lb

## 2022-07-10 DIAGNOSIS — K5730 Diverticulosis of large intestine without perforation or abscess without bleeding: Secondary | ICD-10-CM | POA: Diagnosis not present

## 2022-07-10 DIAGNOSIS — Z86010 Personal history of colonic polyps: Secondary | ICD-10-CM

## 2022-07-10 DIAGNOSIS — Z1211 Encounter for screening for malignant neoplasm of colon: Secondary | ICD-10-CM

## 2022-07-10 DIAGNOSIS — Z09 Encounter for follow-up examination after completed treatment for conditions other than malignant neoplasm: Secondary | ICD-10-CM

## 2022-07-10 DIAGNOSIS — D122 Benign neoplasm of ascending colon: Secondary | ICD-10-CM

## 2022-07-10 DIAGNOSIS — K648 Other hemorrhoids: Secondary | ICD-10-CM

## 2022-07-10 MED ORDER — SODIUM CHLORIDE 0.9 % IV SOLN
500.0000 mL | Freq: Once | INTRAVENOUS | Status: DC
Start: 2022-07-10 — End: 2022-07-10

## 2022-07-10 NOTE — Progress Notes (Signed)
[facility] History and Physical   Primary Care Physician:  Georgianne Fick, MD   Reason for Procedure:  History of adenomatous colon polyps  Plan:    Surveillance colonoscopy with possible interventions as needed     HPI: Mike Schmidt is a very pleasant 57 y.o. male here for surveillance colonoscopy. Denies any nausea, vomiting, abdominal pain, melena or bright red blood per rectum  The risks and benefits as well as alternatives of endoscopic procedure(s) have been discussed and reviewed. All questions answered. The patient agrees to proceed.    Past Medical History:  Diagnosis Date   Allergic rhinitis, cause unspecified    Pollen   Allergy    Anxiety    Asthma    CAD (coronary artery disease)    2022   Dyslipidemia (high LDL; low HDL) 02/15/2021   H/O cold sores    HTN (hypertension) 02/15/2021   Hypertension    Internal hemorrhoids without mention of complication    Myocardial infarction [facility])    2022   Prediabetes 01/2011    Past Surgical History:  Procedure Laterality Date   COLONOSCOPY  11/12/2017   CORONARY STENT INTERVENTION N/A 02/14/2021   Procedure: CORONARY STENT INTERVENTION;  Surgeon: Swaziland, Peter M, MD;  Location: [facility] INVASIVE CV LAB;  Service: Cardiovascular;  Laterality: N/A;   LEFT HEART CATH AND CORONARY ANGIOGRAPHY N/A 02/14/2021   Procedure: LEFT HEART CATH AND CORONARY ANGIOGRAPHY;  Surgeon: Swaziland, Peter M, MD;  Location: [facility] INVASIVE CV LAB;  Service: Cardiovascular;  Laterality: N/A;   MUSCLE BIOPSY     POLYPECTOMY      Prior to Admission medications   Medication Sig Start Date End Date Taking? Authorizing Provider  aspirin EC 81 MG EC tablet Take 1 tablet (81 mg total) by mouth daily. Swallow whole. 02/16/21  Yes Barrett, Joline Salt, PA-C  carvedilol (COREG) 12.5 MG tablet TAKE ONE TABLET BY MOUTH TWICE DAILY WITH A MEAL 06/26/22  Yes Jodelle Gross, NP  cetirizine (ZYRTEC) 10 MG tablet Take 10 mg by mouth at  bedtime.     Yes [provider]  CINNAMON PO Take 1 capsule by mouth daily.   Yes [provider]  famciclovir (FAMVIR) 250 MG tablet Take 250 mg by mouth 2 (two) times daily. 10/25/15  Yes [provider]  Fluticasone-Salmeterol (ADVAIR) 250-50 MCG/DOSE AEPB Inhale 1 puff into the lungs every 12 (twelve) hours.     Yes [provider]  GARLIC PO Take 1 capsule by mouth daily.   Yes [provider]  losartan (COZAAR) 25 MG tablet TAKE ONE TABLET BY MOUTH ONE TIME DAILY 06/26/22  Yes Jodelle Gross, NP  metFORMIN (GLUCOPHAGE-XR) 500 MG 24 hr tablet Take 500 mg by mouth every evening. 06/24/22  Yes [provider]  Multiple Vitamins-Minerals (MULTIVITAL) tablet Take 1 tablet by mouth daily.     Yes [provider]  Polyethyl Glycol-Propyl Glycol (SYSTANE) 0.4-0.3 % GEL ophthalmic gel Place 1 application into both eyes at bedtime.   Yes [provider]  ibuprofen (ADVIL) 200 MG tablet Take 400 mg by mouth every 6 (six) hours as needed for headache or moderate pain.    [provider]  nitroGLYCERIN (NITROSTAT) 0.4 MG SL tablet Place 1 tablet (0.4 mg total) under the tongue every 5 (five) minutes x 3 doses as needed for chest pain. 02/15/21   Barrett, Joline Salt, PA-C  OVER THE COUNTER MEDICATION Apply 1 application topically at bedtime as needed (ankle pain).  Hempvana max pain relief    [provider]  Zinc 50 MG TABS Take 50 mg by mouth daily.    [provider]    Current Outpatient Medications  Medication Sig Dispense Refill   aspirin EC 81 MG EC tablet Take 1 tablet (81 mg total) by mouth daily. Swallow whole. 30 tablet 11   carvedilol (COREG) 12.5 MG tablet TAKE ONE TABLET BY MOUTH TWICE DAILY WITH A MEAL 180 tablet 1   cetirizine (ZYRTEC) 10 MG tablet Take 10 mg by mouth at bedtime.       CINNAMON PO Take 1 capsule by mouth daily.     famciclovir (FAMVIR) 250 MG tablet Take 250 mg by mouth 2  (two) times daily.  8   Fluticasone-Salmeterol (ADVAIR) 250-50 MCG/DOSE AEPB Inhale 1 puff into the lungs every 12 (twelve) hours.       GARLIC PO Take 1 capsule by mouth daily.     losartan (COZAAR) 25 MG tablet TAKE ONE TABLET BY MOUTH ONE TIME DAILY 90 tablet 1   metFORMIN (GLUCOPHAGE-XR) 500 MG 24 hr tablet Take 500 mg by mouth every evening.     Multiple Vitamins-Minerals (MULTIVITAL) tablet Take 1 tablet by mouth daily.       Polyethyl Glycol-Propyl Glycol (SYSTANE) 0.4-0.3 % GEL ophthalmic gel Place 1 application into both eyes at bedtime.     ibuprofen (ADVIL) 200 MG tablet Take 400 mg by mouth every 6 (six) hours as needed for headache or moderate pain.     nitroGLYCERIN (NITROSTAT) 0.4 MG SL tablet Place 1 tablet (0.4 mg total) under the tongue every 5 (five) minutes x 3 doses as needed for chest pain. 25 tablet 12   OVER THE COUNTER MEDICATION Apply 1 application topically at bedtime as needed (ankle pain). Hempvana max pain relief     Zinc 50 MG TABS Take 50 mg by mouth daily.     Current Facility-Administered Medications  Medication Dose Route Frequency Provider Last Rate Last Admin   0.9 %  sodium chloride infusion  500 mL Intravenous Once Napoleon Form, MD        Allergies as of 07/10/2022   (No Known Allergies)    Family History  Problem Relation Age of Onset   Diabetes Mother        T2DM   Kidney disease Mother    Hypertension Father    Depression Father    Hypertension Sister    Colon polyps Sister    Hypertension Brother    Diabetes Brother        T2DM   Colon cancer Neg Hx    Esophageal cancer Neg Hx    Rectal cancer Neg Hx    Stomach cancer Neg Hx     Social History   Socioeconomic History   Marital status: Married    Spouse name: Not on file   Number of children: 1   Years of education: Not on file   Highest education level: Not on file  Occupational History   Occupation: Hospital doctor  Tobacco Use   Smoking status: Former    Types:  Cigarettes    Quit date: 2007    Years since quitting: 17.3   Smokeless tobacco: Never  Vaping Use   Vaping Use: Never used  Substance and Sexual Activity   Alcohol use: No   Drug use: Not Currently   Sexual activity: Not on file  Other Topics Concern   Not on file  Social History Narrative  Not on file   Social Determinants of Health   Financial Resource Strain: Not on file  Food Insecurity: Not on file  Transportation Needs: Not on file  Physical Activity: Not on file  Stress: Not on file  Social Connections: Not on file  Intimate Partner Violence: Not on file    Review of Systems:  All other review of systems negative except as mentioned in the HPI.  Physical Exam: Vital signs in last 24 hours: Blood Pressure 135/89   Pulse 62   Temperature 98.6 F (37 C) (Temporal)   Respiration 10   Height 5\' 8"  (1.727 m)   Weight 191 lb (86.6 kg)   Oxygen Saturation 98%   Body Mass Index 29.04 kg/m  General:   Alert, NAD Lungs:  Clear .   Heart:  Regular rate and rhythm Abdomen:  Soft, nontender and nondistended. Neuro/Psych:  Alert and cooperative. Normal mood and affect. A and O x 3  Reviewed labs, radiology imaging, old records and pertinent past GI work up  Patient is appropriate for planned procedure(s) and anesthesia in an ambulatory setting   K. Scherry Ran , MD [phone]

## 2022-07-10 NOTE — Progress Notes (Signed)
Report given to PACU, vss 

## 2022-07-10 NOTE — Progress Notes (Signed)
Pt's states no medical or surgical changes since previsit or office visit. 

## 2022-07-10 NOTE — Patient Instructions (Addendum)
Impression/Recommendations:  Polyp, diverticulosis, and hemorrhoid handouts given to patient.  Await pathology results.  YOU HAD AN ENDOSCOPIC PROCEDURE TODAY AT THE [facility]:   Refer to the procedure report that was given to you for any specific questions about what was found during the examination.  If the procedure report does not answer your questions, please call your gastroenterologist to clarify.  If you requested that your care partner not be given the details of your procedure findings, then the procedure report has been included in a sealed envelope for you to review at your convenience later.  YOU SHOULD EXPECT: Some feelings of bloating in the abdomen. Passage of more gas than usual.  Walking can help get rid of the air that was put into your GI tract during the procedure and reduce the bloating. If you had a lower endoscopy (such as a colonoscopy or flexible sigmoidoscopy) you may notice spotting of blood in your stool or on the toilet paper. If you underwent a bowel prep for your procedure, you may not have a normal bowel movement for a few days.  Please Note:  You might notice some irritation and congestion in your nose or some drainage.  This is from the oxygen used during your procedure.  There is no need for concern and it should clear up in a day or so.  SYMPTOMS TO REPORT IMMEDIATELY:  Following lower endoscopy (colonoscopy or flexible sigmoidoscopy):  Excessive amounts of blood in the stool  Significant tenderness or worsening of abdominal pains  Swelling of the abdomen that is new, acute  Fever of 100F or higher For urgent or emergent issues, a gastroenterologist can be reached at any hour by calling [phone]. Do not use MyChart messaging for urgent concerns.    DIET:  We do recommend a small meal at first, but then you may proceed to your regular diet.  Drink plenty of fluids but you should avoid alcoholic beverages for 24 hours.  ACTIVITY:  You  should plan to take it easy for the rest of today and you should NOT DRIVE or use heavy machinery until tomorrow (because of the sedation medicines used during the test).    FOLLOW UP: Our staff will call the number listed on your records the next business day following your procedure.  We will call around 7:15- 8:00 am to check on you and address any questions or concerns that you may have regarding the information given to you following your procedure. If we do not reach you, we will leave a message.     If any biopsies were taken you will be contacted by phone or by letter within the next 1-3 weeks.  Please call us at [phone] if you have not heard about the biopsies in 3 weeks.    SIGNATURES/CONFIDENTIALITY: You and/or your care partner have signed paperwork which will be entered into your electronic medical record.  These signatures attest to the fact that that the information above on your After Visit Summary has been reviewed and is understood.  Full responsibility of the confidentiality of this discharge information lies with you and/or your care-partner.

## 2022-07-10 NOTE — Progress Notes (Signed)
Called to room to assist during endoscopic procedure.  Patient ID and intended procedure confirmed with present staff. Received instructions for my participation in the procedure from the performing physician.  

## 2022-07-10 NOTE — Op Note (Addendum)
[facility] Patient Name: Mike Schmidt Procedure Date: 07/10/2022 7:35 AM MRN: [id number] Endoscopist: Napoleon Form , MD, [id number] Age: 57 Referring MD:  Date of Birth: 1965-11-24 Gender: Male Account #: [id number] Procedure:                Colonoscopy Indications:              High risk colon cancer surveillance: Personal                            history of adenoma (10 mm or greater in size), High                            risk colon cancer surveillance: Personal history of                            multiple (3 or more) adenomas Medicines:                Monitored Anesthesia Care Procedure:                Pre-Anesthesia Assessment:                           - Prior to the procedure, a History and Physical                            was performed, and patient medications and                            allergies were reviewed. The patient's tolerance of                            previous anesthesia was also reviewed. The risks                            and benefits of the procedure and the sedation                            options and risks were discussed with the patient.                            All questions were answered, and informed consent                            was obtained. Prior Anticoagulants: The patient has                            taken no anticoagulant or antiplatelet agents. ASA                            Grade Assessment: III - A patient with severe                            systemic disease. After reviewing the risks and  benefits, the patient was deemed in satisfactory                            condition to undergo the procedure.                           After obtaining informed consent, the colonoscope                            was passed under direct vision. Throughout the                            procedure, the patient's blood pressure, pulse, and                            oxygen saturations were  monitored continuously. The                            Olympus PCF-[id number] (#[id number]) Colonoscope was                            introduced through the anus and advanced to the the                            cecum, identified by appendiceal orifice and                            ileocecal valve. The colonoscopy was performed                            without difficulty. The patient tolerated the                            procedure well. The quality of the bowel                            preparation was good. The ileocecal valve,                            appendiceal orifice, and rectum were photographed. Scope In: 8:12:39 AM Scope Out: 8:27:07 AM Scope Withdrawal Time: 0 hours 12 minutes 11 seconds  Total Procedure Duration: 0 hours 14 minutes 28 seconds  Findings:                 The perianal and digital rectal examinations were                            normal.                           A 4 mm polyp was found in the ascending colon. The                            polyp was sessile. The polyp was removed with a  cold snare. Resection and retrieval were complete.                           Scattered small-mouthed diverticula were found in                            the sigmoid colon and descending colon.                           Non-bleeding internal hemorrhoids were found during                            retroflexion. The hemorrhoids were small. Complications:            No immediate complications. Estimated Blood Loss:     Estimated blood loss was minimal. Impression:               - One 4 mm polyp in the ascending colon, removed                            with a cold snare. Resected and retrieved.                           - Diverticulosis in the sigmoid colon and in the                            descending colon.                           - Non-bleeding internal hemorrhoids. Recommendation:           - Patient has a contact number available for                             emergencies. The signs and symptoms of potential                            delayed complications were discussed with the                            patient. Return to normal activities tomorrow.                            Written discharge instructions were provided to the                            patient.                           - Resume previous diet.                           - Continue present medications.                           - Await pathology results.                           -  Repeat colonoscopy in 5 years for surveillance                            based on pathology results. Napoleon Form, MD 07/10/2022 8:37:23 AM This report has been signed electronically.

## 2022-07-13 ENCOUNTER — Telehealth: Payer: Self-pay

## 2022-07-13 ENCOUNTER — Telehealth: Payer: Self-pay | Admitting: Gastroenterology

## 2022-07-13 NOTE — Telephone Encounter (Signed)
  Follow up Call-     07/10/2022    7:29 AM  Call back number  Post procedure Call Back phone  # [phone]  Permission to leave phone message Yes     Patient questions:  Do you have a fever, pain , or abdominal swelling? No. Pain Score  0 *  Have you tolerated food without any problems? Yes.    Have you been able to return to your normal activities? Yes.    Do you have any questions about your discharge instructions: Diet   No. Medications  No. Follow up visit  No.  Do you have questions or concerns about your Care? No.  Actions: * If pain score is 4 or above: No action needed, pain <4.

## 2022-07-13 NOTE — Telephone Encounter (Signed)
Patient called in requesting to speak a manager in regards last visit (5/17/).. message was sent to sheri.

## 2022-07-22 ENCOUNTER — Encounter: Payer: Self-pay | Admitting: Gastroenterology

## 2022-07-31 DIAGNOSIS — I10 Essential (primary) hypertension: Secondary | ICD-10-CM | POA: Diagnosis not present

## 2022-07-31 DIAGNOSIS — R7303 Prediabetes: Secondary | ICD-10-CM | POA: Diagnosis not present

## 2022-08-07 NOTE — Telephone Encounter (Signed)
Patient inquiring on procedure coding . Please advise.

## 2022-08-10 NOTE — Telephone Encounter (Signed)
[place], patient has questions about coding for her recent procedure.

## 2022-08-11 NOTE — Telephone Encounter (Signed)
Left message regarding patients concerns.

## 2022-08-11 NOTE — Telephone Encounter (Signed)
Spoke to patient and explained that procedure was coded due to his previous history of polyps. Patient acknowledged and understood.

## 2022-12-04 ENCOUNTER — Other Ambulatory Visit: Payer: Self-pay | Admitting: Adult Health

## 2022-12-17 ENCOUNTER — Other Ambulatory Visit: Payer: Self-pay | Admitting: Adult Health

## 2023-01-01 ENCOUNTER — Other Ambulatory Visit: Payer: Self-pay | Admitting: Adult Health

## 2023-02-05 DIAGNOSIS — I2510 Atherosclerotic heart disease of native coronary artery without angina pectoris: Secondary | ICD-10-CM | POA: Diagnosis not present

## 2023-02-05 DIAGNOSIS — I10 Essential (primary) hypertension: Secondary | ICD-10-CM | POA: Diagnosis not present

## 2023-02-05 DIAGNOSIS — E782 Mixed hyperlipidemia: Secondary | ICD-10-CM | POA: Diagnosis not present

## 2023-02-05 DIAGNOSIS — R7303 Prediabetes: Secondary | ICD-10-CM | POA: Diagnosis not present

## 2023-02-06 LAB — LAB REPORT - SCANNED
A1c: 6.7
EGFR: 100

## 2023-02-11 ENCOUNTER — Encounter: Payer: Self-pay | Admitting: Cardiovascular Disease

## 2023-02-12 DIAGNOSIS — I10 Essential (primary) hypertension: Secondary | ICD-10-CM | POA: Diagnosis not present

## 2023-02-12 DIAGNOSIS — E782 Mixed hyperlipidemia: Secondary | ICD-10-CM | POA: Diagnosis not present

## 2023-02-12 DIAGNOSIS — Z0000 Encounter for general adult medical examination without abnormal findings: Secondary | ICD-10-CM | POA: Diagnosis not present

## 2023-02-12 DIAGNOSIS — I2510 Atherosclerotic heart disease of native coronary artery without angina pectoris: Secondary | ICD-10-CM | POA: Diagnosis not present

## 2023-02-19 ENCOUNTER — Ambulatory Visit: Payer: BC Managed Care – PPO | Attending: Cardiovascular Disease | Admitting: Cardiovascular Disease

## 2023-02-19 ENCOUNTER — Encounter: Payer: Self-pay | Admitting: Cardiovascular Disease

## 2023-02-19 VITALS — BP 123/72 | HR 68 | Ht 68.0 in | Wt 200.6 lb

## 2023-02-19 DIAGNOSIS — I214 Non-ST elevation (NSTEMI) myocardial infarction: Secondary | ICD-10-CM

## 2023-02-19 DIAGNOSIS — E785 Hyperlipidemia, unspecified: Secondary | ICD-10-CM | POA: Diagnosis not present

## 2023-02-19 DIAGNOSIS — I255 Ischemic cardiomyopathy: Secondary | ICD-10-CM

## 2023-02-19 DIAGNOSIS — I10 Essential (primary) hypertension: Secondary | ICD-10-CM | POA: Diagnosis not present

## 2023-02-19 NOTE — Progress Notes (Signed)
02/19/2023 Mike Schmidt   24-Oct-1965  [id number]  Primary Physician Mike Fick, MD Primary Cardiologist: Mike Gess MD Mike Schmidt, [place]  HPI:  Mike Schmidt is a 57 y.o.   mild to moderately overweight married African-American male father of 5 (1 biologic, for with his wife), grandfather 2 grandchildren who I last saw in the office 06/13/2021.  He is accompanied by his wife Arleen today.  He works as a Database administrator.  He is here for his first post hospital follow-up visit.  He was recently hospitalized at the end of December with a non-STEMI.  His troponins rose to 779.  He underwent diagnostic coronary angiography by Mike Schmidt revealing a high-grade mid AV groove circumflex stenosis which was stented with a 3.5 mm x 26 mm long Medtronic frontier drug-eluting stent.  He had no other significant CAD.  He said no recurrent symptoms.  He remains on aspirin and Brilinta.  Other problems include treated hypertension, diabetes and hyperlipidemia.  His brother did have CAD as well.  Since I saw him he a year and a half ago he continues to do well.  His ejection fraction did increase up to 55% by 2D echo 05/30/2021.  He is completely asymptomatic.   Current Meds  Medication Sig   aspirin EC 81 MG EC tablet Take 1 tablet (81 mg total) by mouth daily. Swallow whole.   carvedilol (COREG) 12.5 MG tablet TAKE ONE TABLET BY MOUTH TWICE DAILY with a meal   cetirizine (ZYRTEC) 10 MG tablet Take 10 mg by mouth at bedtime.     CINNAMON PO Take 1 capsule by mouth daily.   famciclovir (FAMVIR) 250 MG tablet Take 250 mg by mouth 2 (two) times daily.   Fluticasone-Salmeterol (ADVAIR) 250-50 MCG/DOSE AEPB Inhale 1 puff into the lungs every 12 (twelve) hours.     GARLIC PO Take 1 capsule by mouth daily.   ibuprofen (ADVIL) 200 MG tablet Take 400 mg by mouth every 6 (six) hours as needed for headache or moderate pain.   losartan (COZAAR) 25 MG tablet Take 1 tablet  (25 mg total) by mouth daily. Pt needs to keep upcoming appt in Dec to continue refills   metFORMIN (GLUCOPHAGE-XR) 500 MG 24 hr tablet Take 500 mg by mouth every evening.   Multiple Vitamins-Minerals (MULTIVITAL) tablet Take 1 tablet by mouth daily.     nitroGLYCERIN (NITROSTAT) 0.4 MG SL tablet Place 1 tablet (0.4 mg total) under the tongue every 5 (five) minutes x 3 doses as needed for chest pain.   OVER THE COUNTER MEDICATION Apply 1 application topically at bedtime as needed (ankle pain). Hempvana max pain relief   Polyethyl Glycol-Propyl Glycol (SYSTANE) 0.4-0.3 % GEL ophthalmic gel Place 1 application into both eyes at bedtime.   WEGOVY 0.5 MG/0.5ML SOAJ Inject 0.5 mg into the skin once a week.   Zinc 50 MG TABS Take 50 mg by mouth daily.     No Known Allergies  Social History   Socioeconomic History   Marital status: Married    Spouse name: Not on file   Number of children: 1   Years of education: Not on file   Highest education level: Not on file  Occupational History   Occupation: Fabric cutter  Tobacco Use   Smoking status: Former    Current packs/day: 0.00    Types: Cigarettes    Quit date: 2007    Years since quitting: 18.0  Smokeless tobacco: Never  Vaping Use   Vaping status: Never Used  Substance and Sexual Activity   Alcohol use: No   Drug use: Not Currently   Sexual activity: Not on file  Other Topics Concern   Not on file  Social History Narrative   Not on file   Social Drivers of Health   Financial Resource Strain: Not on file  Food Insecurity: Not on file  Transportation Needs: Not on file  Physical Activity: Not on file  Stress: Not on file  Social Connections: Unknown (07/02/2021)   Received from [facility], [facility]   Social Network    Social Network: Not on file  Intimate Partner Violence: Unknown (05/29/2021)   Received from [facility], [facility]   HITS    Physically Hurt: Not on file    Insult or Talk Down To: Not on file     Threaten Physical Harm: Not on file    Scream or Curse: Not on file     Review of Systems: General: negative for chills, fever, night sweats or weight changes.  Cardiovascular: negative for chest pain, dyspnea on exertion, edema, orthopnea, palpitations, paroxysmal nocturnal dyspnea or shortness of breath Dermatological: negative for rash Respiratory: negative for cough or wheezing Urologic: negative for hematuria Abdominal: negative for nausea, vomiting, diarrhea, bright red blood per rectum, melena, or hematemesis Neurologic: negative for visual changes, syncope, or dizziness All other systems reviewed and are otherwise negative except as noted above.    Blood pressure 123/72, pulse 68, height 5\' 8"  (1.727 m), weight 200 lb 9.6 oz (91 kg), SpO2 95%.  General appearance: alert and no distress Neck: no adenopathy, no carotid bruit, no JVD, supple, symmetrical, trachea midline, and thyroid not enlarged, symmetric, no tenderness/mass/nodules Lungs: clear to auscultation bilaterally Heart: regular rate and rhythm, S1, S2 normal, no murmur, click, rub or gallop Extremities: extremities normal, atraumatic, no cyanosis or edema Pulses: 2+ and symmetric Skin: Skin color, texture, turgor normal. No rashes or lesions Neurologic: Grossly normal  EKG EKG Interpretation Date/Time:  Friday February 19 2023 15:02:29 EST Ventricular Rate:  68 PR Interval:  150 QRS Duration:  96 QT Interval:  366 QTC Calculation: 389 R Axis:   43  Text Interpretation: Normal sinus rhythm Low voltage QRS When compared with ECG of 15-Feb-2021 05:04, Questionable change in QRS axis Nonspecific T wave abnormality no longer evident in Lateral leads Confirmed by Mike Schmidt [phone]) on 02/19/2023 3:13:14 PM    ASSESSMENT AND PLAN:   NSTEMI (non-ST elevated myocardial infarction) (HCC) History of non-STEMI 02/14/2021 with troponins that went up to 779.  He was catheterized by Mike Schmidt revealing a  high-grade AV groove circumflex stenosis which was stented using a 3.5 mm x 26 mm Medtronic frontier drug-eluting stent.  Is currently on baby aspirin alone.  He is completely asymptomatic.  Dyslipidemia (high LDL; low HDL) History of dyslipidemia currently not on statin therapy.  His last lipid profile performed 02/05/2023 revealed total cholesterol 117, LDL of 62 and HDL of 39.  It is unclear to me why his statin drug was discontinued.  Will recheck a fasting lipid profile and depending on this we will decide whether or not to put him back on statin therapy.  HTN (hypertension) History of essential hypertension blood pressure measured today at 123/72.  He is on carvedilol and losartan.  Ischemic cardiomyopathy History of ischemic cardiomyopathy with an EF initially after his non-STEMI of 40 to 45%.  Follow-up echo performed 05/30/21 revealed  an increase in his EF to 55% with grade 1 diastolic dysfunction.  He is completely asymptomatic.     Mike Gess MD FACP,FACC,FAHA, [facility] 02/19/2023 3:26 PM

## 2023-02-19 NOTE — Assessment & Plan Note (Signed)
History of non-STEMI 02/14/2021 with troponins that went up to 779.  He was catheterized by Dr. [place] revealing a high-grade AV groove circumflex stenosis which was stented using a 3.5 mm x 26 mm Medtronic frontier drug-eluting stent.  Is currently on baby aspirin alone.  He is completely asymptomatic.

## 2023-02-19 NOTE — Assessment & Plan Note (Signed)
History of dyslipidemia currently not on statin therapy.  His last lipid profile performed 02/05/2023 revealed total cholesterol 117, LDL of 62 and HDL of 39.  It is unclear to me why his statin drug was discontinued.  Will recheck a fasting lipid profile and depending on this we will decide whether or not to put him back on statin therapy.

## 2023-02-19 NOTE — Assessment & Plan Note (Signed)
History of ischemic cardiomyopathy with an EF initially after his non-STEMI of 40 to 45%.  Follow-up echo performed 05/30/21 revealed an increase in his EF to 55% with grade 1 diastolic dysfunction.  He is completely asymptomatic.

## 2023-02-19 NOTE — Patient Instructions (Signed)
Medication Instructions:  Your physician recommends that you continue on your current medications as directed. Please refer to the Current Medication list given to you today.  *If you need a refill on your cardiac medications before your next appointment, please call your pharmacy*   Lab Work: Your physician recommends that you return for lab work in: the next week or 2 for FASTING lipid/liver panel  If you have labs (blood work) drawn today and your tests are completely normal, you will receive your results only by: MyChart Message (if you have MyChart) OR A paper copy in the mail If you have any lab test that is abnormal or we need to change your treatment, we will call you to review the results.   Follow-Up: At [facility], you and your health needs are our priority.  As part of our continuing mission to provide you with exceptional heart care, we have created designated Provider Care Teams.  These Care Teams include your primary Cardiologist (physician) and Advanced Practice Providers (APPs -  Physician Assistants and Nurse Practitioners) who all work together to provide you with the care you need, when you need it.  We recommend signing up for the patient portal called "MyChart".  Sign up information is provided on this After Visit Summary.  MyChart is used to connect with patients for Virtual Visits (Telemedicine).  Patients are able to view lab/test results, encounter notes, upcoming appointments, etc.  Non-urgent messages can be sent to your provider as well.   To learn more about what you can do with MyChart, go to [email or web address].    Your next appointment:   12 month(s)  Provider:   Jonathan Berry, MD    

## 2023-02-19 NOTE — Assessment & Plan Note (Signed)
History of essential hypertension blood pressure measured today at 123/72.  He is on carvedilol and losartan.

## 2023-03-19 DIAGNOSIS — I255 Ischemic cardiomyopathy: Secondary | ICD-10-CM | POA: Diagnosis not present

## 2023-03-19 DIAGNOSIS — I214 Non-ST elevation (NSTEMI) myocardial infarction: Secondary | ICD-10-CM | POA: Diagnosis not present

## 2023-03-19 DIAGNOSIS — E785 Hyperlipidemia, unspecified: Secondary | ICD-10-CM | POA: Diagnosis not present

## 2023-03-20 LAB — LIPID PANEL
Chol/HDL Ratio: 4.8 {ratio} (ref 0.0–5.0)
Cholesterol, Total: 221 mg/dL — ABNORMAL HIGH (ref 100–199)
HDL: 46 mg/dL (ref >39)
LDL Chol Calc (NIH): 159 mg/dL — ABNORMAL HIGH (ref 0–99)
Triglycerides: 90 mg/dL (ref 0–149)
VLDL Cholesterol Cal: 16 mg/dL (ref 5–40)

## 2023-03-20 LAB — HEPATIC FUNCTION PANEL
ALT: 30 [IU]/L (ref 0–44)
AST: 26 [IU]/L (ref 0–40)
Albumin: 4.7 g/dL (ref 3.8–4.9)
Alkaline Phosphatase: 68 [IU]/L (ref 44–121)
Bilirubin Total: 0.6 mg/dL (ref 0.0–1.2)
Bilirubin, Direct: 0.2 mg/dL (ref 0.00–0.40)
Total Protein: 7 g/dL (ref 6.0–8.5)

## 2023-03-22 DIAGNOSIS — E785 Hyperlipidemia, unspecified: Secondary | ICD-10-CM

## 2023-03-23 MED ORDER — ATORVASTATIN CALCIUM 80 MG PO TABS: 80.0000 mg | ORAL_TABLET | Freq: Every day | ORAL | 3 refills | Status: DC

## 2023-05-07 DIAGNOSIS — I10 Essential (primary) hypertension: Secondary | ICD-10-CM | POA: Diagnosis not present

## 2023-05-07 DIAGNOSIS — E782 Mixed hyperlipidemia: Secondary | ICD-10-CM | POA: Diagnosis not present

## 2023-05-07 DIAGNOSIS — R7303 Prediabetes: Secondary | ICD-10-CM | POA: Diagnosis not present

## 2023-05-07 DIAGNOSIS — I2510 Atherosclerotic heart disease of native coronary artery without angina pectoris: Secondary | ICD-10-CM | POA: Diagnosis not present

## 2023-05-07 LAB — LAB REPORT - SCANNED
A1c: 6.2
EGFR: 73

## 2023-05-14 DIAGNOSIS — E782 Mixed hyperlipidemia: Secondary | ICD-10-CM | POA: Diagnosis not present

## 2023-05-14 DIAGNOSIS — I10 Essential (primary) hypertension: Secondary | ICD-10-CM | POA: Diagnosis not present

## 2023-05-14 DIAGNOSIS — R7303 Prediabetes: Secondary | ICD-10-CM | POA: Diagnosis not present

## 2023-05-14 DIAGNOSIS — I2510 Atherosclerotic heart disease of native coronary artery without angina pectoris: Secondary | ICD-10-CM | POA: Diagnosis not present

## 2023-05-28 ENCOUNTER — Other Ambulatory Visit: Payer: Self-pay

## 2023-05-28 MED ORDER — CARVEDILOL 12.5 MG PO TABS: 12.5000 mg | ORAL_TABLET | Freq: Two times a day (BID) | ORAL | 2 refills | Status: AC

## 2023-05-28 MED ORDER — ATORVASTATIN CALCIUM 80 MG PO TABS
80.0000 mg | ORAL_TABLET | Freq: Every day | ORAL | 2 refills | Status: AC
Start: 2023-05-28 — End: ?

## 2023-05-28 MED ORDER — LOSARTAN POTASSIUM 25 MG PO TABS
25.0000 mg | ORAL_TABLET | Freq: Every day | ORAL | 2 refills | Status: AC
Start: 2023-05-28 — End: ?

## 2023-06-25 DIAGNOSIS — E785 Hyperlipidemia, unspecified: Secondary | ICD-10-CM | POA: Diagnosis not present

## 2023-06-26 LAB — LIPID PANEL
Chol/HDL Ratio: 2.8 ratio (ref 0.0–5.0)
Cholesterol, Total: 110 mg/dL (ref 100–199)
HDL: 40 mg/dL (ref >39)
LDL Chol Calc (NIH): 56 mg/dL (ref 0–99)
Triglycerides: 64 mg/dL (ref 0–149)
VLDL Cholesterol Cal: 14 mg/dL (ref 5–40)

## 2023-10-01 DIAGNOSIS — I10 Essential (primary) hypertension: Secondary | ICD-10-CM | POA: Diagnosis not present

## 2023-10-01 DIAGNOSIS — E782 Mixed hyperlipidemia: Secondary | ICD-10-CM | POA: Diagnosis not present

## 2023-10-01 DIAGNOSIS — I2510 Atherosclerotic heart disease of native coronary artery without angina pectoris: Secondary | ICD-10-CM | POA: Diagnosis not present

## 2023-10-01 DIAGNOSIS — R748 Abnormal levels of other serum enzymes: Secondary | ICD-10-CM | POA: Diagnosis not present

## 2023-10-01 DIAGNOSIS — R7303 Prediabetes: Secondary | ICD-10-CM | POA: Diagnosis not present

## 2023-10-22 DIAGNOSIS — I10 Essential (primary) hypertension: Secondary | ICD-10-CM | POA: Diagnosis not present

## 2023-10-22 DIAGNOSIS — Z23 Encounter for immunization: Secondary | ICD-10-CM | POA: Diagnosis not present

## 2023-10-22 DIAGNOSIS — I2510 Atherosclerotic heart disease of native coronary artery without angina pectoris: Secondary | ICD-10-CM | POA: Diagnosis not present

## 2023-10-22 DIAGNOSIS — E782 Mixed hyperlipidemia: Secondary | ICD-10-CM | POA: Diagnosis not present

## 2023-10-22 DIAGNOSIS — R7303 Prediabetes: Secondary | ICD-10-CM | POA: Diagnosis not present

## 2023-12-17 DIAGNOSIS — Z125 Encounter for screening for malignant neoplasm of prostate: Secondary | ICD-10-CM | POA: Diagnosis not present

## 2023-12-17 DIAGNOSIS — R7303 Prediabetes: Secondary | ICD-10-CM | POA: Diagnosis not present

## 2023-12-17 DIAGNOSIS — I2510 Atherosclerotic heart disease of native coronary artery without angina pectoris: Secondary | ICD-10-CM | POA: Diagnosis not present

## 2023-12-17 DIAGNOSIS — E782 Mixed hyperlipidemia: Secondary | ICD-10-CM | POA: Diagnosis not present

## 2023-12-17 DIAGNOSIS — I10 Essential (primary) hypertension: Secondary | ICD-10-CM | POA: Diagnosis not present

## 2023-12-17 DIAGNOSIS — R748 Abnormal levels of other serum enzymes: Secondary | ICD-10-CM | POA: Diagnosis not present

## 2023-12-24 DIAGNOSIS — Z23 Encounter for immunization: Secondary | ICD-10-CM | POA: Diagnosis not present

## 2023-12-24 DIAGNOSIS — Z0000 Encounter for general adult medical examination without abnormal findings: Secondary | ICD-10-CM | POA: Diagnosis not present

## 2023-12-24 DIAGNOSIS — I2510 Atherosclerotic heart disease of native coronary artery without angina pectoris: Secondary | ICD-10-CM | POA: Diagnosis not present

## 2023-12-24 DIAGNOSIS — E782 Mixed hyperlipidemia: Secondary | ICD-10-CM | POA: Diagnosis not present

## 2023-12-24 DIAGNOSIS — R7303 Prediabetes: Secondary | ICD-10-CM | POA: Diagnosis not present

## 2024-02-07 ENCOUNTER — Other Ambulatory Visit: Payer: Self-pay | Admitting: Cardiovascular Disease
